# Patient Record
Sex: Female | Born: 1950
Health system: Southern US, Community
[De-identification: ages and names within clinical notes are randomized; demographics above are authoritative.]

## PROBLEM LIST (undated history)

## (undated) DIAGNOSIS — I219 Acute myocardial infarction, unspecified: Secondary | ICD-10-CM

## (undated) DIAGNOSIS — I10 Essential (primary) hypertension: Secondary | ICD-10-CM

## (undated) DIAGNOSIS — M1990 Unspecified osteoarthritis, unspecified site: Secondary | ICD-10-CM

## (undated) DIAGNOSIS — I639 Cerebral infarction, unspecified: Secondary | ICD-10-CM

## (undated) HISTORY — DX: Essential (primary) hypertension: I10

## (undated) HISTORY — PX: TUBAL LIGATION: SHX77

## (undated) HISTORY — PX: ABDOMINAL HYSTERECTOMY: SHX81

## (undated) HISTORY — DX: Acute myocardial infarction, unspecified: I21.9

## (undated) HISTORY — DX: Cerebral infarction, unspecified: I63.9

---

## 2002-09-11 DIAGNOSIS — I219 Acute myocardial infarction, unspecified: Secondary | ICD-10-CM

## 2002-09-11 HISTORY — DX: Acute myocardial infarction, unspecified: I21.9

## 2013-09-11 DIAGNOSIS — I639 Cerebral infarction, unspecified: Secondary | ICD-10-CM

## 2013-09-11 HISTORY — DX: Cerebral infarction, unspecified: I63.9

## 2018-06-14 DIAGNOSIS — Z719 Counseling, unspecified: Secondary | ICD-10-CM | POA: Diagnosis not present

## 2018-06-14 DIAGNOSIS — S8390XS Sprain of unspecified site of unspecified knee, sequela: Secondary | ICD-10-CM | POA: Diagnosis not present

## 2018-06-14 DIAGNOSIS — M79609 Pain in unspecified limb: Secondary | ICD-10-CM | POA: Diagnosis not present

## 2018-06-14 DIAGNOSIS — M549 Dorsalgia, unspecified: Secondary | ICD-10-CM | POA: Diagnosis not present

## 2018-06-30 DIAGNOSIS — R21 Rash and other nonspecific skin eruption: Secondary | ICD-10-CM | POA: Diagnosis not present

## 2018-06-30 DIAGNOSIS — N760 Acute vaginitis: Secondary | ICD-10-CM | POA: Diagnosis not present

## 2018-06-30 DIAGNOSIS — L299 Pruritus, unspecified: Secondary | ICD-10-CM | POA: Diagnosis not present

## 2018-06-30 DIAGNOSIS — L989 Disorder of the skin and subcutaneous tissue, unspecified: Secondary | ICD-10-CM | POA: Diagnosis not present

## 2018-07-27 DIAGNOSIS — S8390XS Sprain of unspecified site of unspecified knee, sequela: Secondary | ICD-10-CM | POA: Diagnosis not present

## 2018-07-27 DIAGNOSIS — Z209 Contact with and (suspected) exposure to unspecified communicable disease: Secondary | ICD-10-CM | POA: Diagnosis not present

## 2018-07-27 DIAGNOSIS — Z719 Counseling, unspecified: Secondary | ICD-10-CM | POA: Diagnosis not present

## 2018-07-27 DIAGNOSIS — M79609 Pain in unspecified limb: Secondary | ICD-10-CM | POA: Diagnosis not present

## 2018-07-29 DIAGNOSIS — M25562 Pain in left knee: Secondary | ICD-10-CM | POA: Diagnosis not present

## 2018-07-29 DIAGNOSIS — M170 Bilateral primary osteoarthritis of knee: Secondary | ICD-10-CM | POA: Diagnosis not present

## 2018-07-29 DIAGNOSIS — M25561 Pain in right knee: Secondary | ICD-10-CM | POA: Diagnosis not present

## 2018-07-30 ENCOUNTER — Ambulatory Visit (INDEPENDENT_AMBULATORY_CARE_PROVIDER_SITE_OTHER): Payer: Medicare HMO | Admitting: Internal Medicine

## 2018-07-30 ENCOUNTER — Encounter: Payer: Self-pay | Admitting: Internal Medicine

## 2018-07-30 VITALS — BP 120/76 | HR 91 | Temp 97.5°F | Ht 63.0 in | Wt 152.0 lb

## 2018-07-30 DIAGNOSIS — R748 Abnormal levels of other serum enzymes: Secondary | ICD-10-CM | POA: Diagnosis not present

## 2018-07-30 DIAGNOSIS — F1020 Alcohol dependence, uncomplicated: Secondary | ICD-10-CM | POA: Diagnosis not present

## 2018-07-30 DIAGNOSIS — M549 Dorsalgia, unspecified: Secondary | ICD-10-CM

## 2018-07-30 DIAGNOSIS — F1010 Alcohol abuse, uncomplicated: Secondary | ICD-10-CM | POA: Diagnosis not present

## 2018-07-30 DIAGNOSIS — I10 Essential (primary) hypertension: Secondary | ICD-10-CM | POA: Diagnosis not present

## 2018-07-30 DIAGNOSIS — M1712 Unilateral primary osteoarthritis, left knee: Secondary | ICD-10-CM

## 2018-07-30 DIAGNOSIS — G8929 Other chronic pain: Secondary | ICD-10-CM

## 2018-07-30 LAB — CMP14 + ANION GAP
ALT: 17 [IU]/L (ref 0–32)
AST: 21 [IU]/L (ref 0–40)
Albumin/Globulin Ratio: 1.8 (ref 1.2–2.2)
Albumin: 4.4 g/dL (ref 3.6–4.8)
Alkaline Phosphatase: 73 [IU]/L (ref 39–117)
Anion Gap: 16 mmol/L (ref 10.0–18.0)
BUN/Creatinine Ratio: 19 (ref 12–28)
BUN: 19 mg/dL (ref 8–27)
Bilirubin Total: 0.3 mg/dL (ref 0.0–1.2)
CO2: 21 mmol/L (ref 20–29)
Calcium: 9.7 mg/dL (ref 8.7–10.3)
Chloride: 104 mmol/L (ref 96–106)
Creatinine, Ser: 1 mg/dL (ref 0.57–1.00)
GFR calc Af Amer: 68 mL/min/{1.73_m2}
GFR calc non Af Amer: 59 mL/min/{1.73_m2} — ABNORMAL LOW
Globulin, Total: 2.5 g/dL (ref 1.5–4.5)
Glucose: 110 mg/dL — ABNORMAL HIGH (ref 65–99)
Potassium: 4.2 mmol/L (ref 3.5–5.2)
Sodium: 141 mmol/L (ref 134–144)
Total Protein: 6.9 g/dL (ref 6.0–8.5)

## 2018-07-30 LAB — CBC
Hematocrit: 31.8 % — ABNORMAL LOW (ref 34.0–46.6)
Hemoglobin: 11 g/dL — ABNORMAL LOW (ref 11.1–15.9)
MCH: 31.6 pg (ref 26.6–33.0)
MCHC: 34.6 g/dL (ref 31.5–35.7)
MCV: 91 fL (ref 79–97)
PLATELETS: 305 10*3/uL (ref 150–450)
RBC: 3.48 x10E6/uL — AB (ref 3.77–5.28)
RDW: 12.7 % (ref 12.3–15.4)
WBC: 5.5 10*3/uL (ref 3.4–10.8)

## 2018-07-30 LAB — LIPID PANEL
CHOL/HDL RATIO: 4.2 ratio (ref 0.0–4.4)
Cholesterol, Total: 241 mg/dL — ABNORMAL HIGH (ref 100–199)
HDL: 57 mg/dL (ref >39)
LDL CALC: 148 mg/dL — AB (ref 0–99)
TRIGLYCERIDES: 179 mg/dL — AB (ref 0–149)
VLDL Cholesterol Cal: 36 mg/dL (ref 5–40)

## 2018-07-30 MED ORDER — ERGOCALCIFEROL 1.25 MG (50000 UT) PO CAPS: 50000.0000 [IU] | ORAL_CAPSULE | ORAL | 0 refills | Status: DC

## 2018-07-30 MED ORDER — QUETIAPINE FUMARATE 50 MG PO TABS: 50.0000 mg | ORAL_TABLET | Freq: Every day | ORAL | 0 refills | Status: DC

## 2018-07-30 MED ORDER — ASPIRIN-DIPYRIDAMOLE ER 25-200 MG PO CP12: 1.0000 | ORAL_CAPSULE | Freq: Every day | ORAL | 0 refills | Status: DC

## 2018-07-30 MED ORDER — ATORVASTATIN CALCIUM 40 MG PO TABS: 40.0000 mg | ORAL_TABLET | Freq: Every day | ORAL | 0 refills | Status: DC

## 2018-07-30 MED ORDER — LISINOPRIL 20 MG PO TABS: 20.0000 mg | ORAL_TABLET | Freq: Every day | ORAL | 0 refills | Status: DC

## 2018-07-30 NOTE — Addendum Note (Signed)
Addended by: Haskel Khan A on: 07/30/2018 12:35 PM   Modules accepted: Orders

## 2018-07-30 NOTE — Patient Instructions (Signed)

## 2018-07-30 NOTE — Progress Notes (Signed)
Subjective:     Patient ID: Judy Martinez , female    DOB: [birth year] , 67 y.o.   MRN: [id number]   Chief Complaint  Patient presents with  . New Patient (Initial Visit)    HPI Pt is here to establish care and [place] this [place].  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 She never ended up with paralysis. She also has chronic back and knee pain and saw MD at [facility] yesterday and was told she has severe OA of L knee and will be having injections.  She has not been evaluated for her back pain in the past, been given tramadol and flexeril prn.  She also states she is supoed to take the Aggrenox bid, but it makes her have nausea.  She is not interested in stop smoking right now.   Past Medical History:  Diagnosis Date  . Heart attack ([place]) 2004  . Hypertension   . Stroke [facility]) 2015     Family History  Problem Relation Age of Onset  . Alcohol abuse Father      Current Outpatient Medications:  .  atorvastatin (LIPITOR) 40 MG tablet, Take 40 mg by mouth daily., Disp: , Rfl:  .  cyclobenzaprine (FLEXERIL) 10 MG tablet, Take 10 mg by mouth 3 (three) times daily as needed for muscle spasms., Disp: , Rfl:  .  dipyridamole-aspirin (AGGRENOX) 200-25 MG 12hr capsule, Take 1 capsule by mouth 2 (two) times daily., Disp: , Rfl:  .  ergocalciferol (VITAMIN D2) 1.25 MG (50000 UT) capsule, Take 50,000 Units by mouth once a week., Disp: , Rfl:  .  ibuprofen (ADVIL,MOTRIN) 800 MG tablet, Take 800 mg by mouth every 8 (eight) hours as needed., Disp: , Rfl:  .  lisinopril (PRINIVIL,ZESTRIL) 20 MG tablet, Take 20 mg by mouth daily., Disp: , Rfl:  .  QUEtiapine (SEROQUEL) 50 MG tablet, Take 50 mg by mouth at bedtime. qhs, Disp: , Rfl:    Allergies  Allergen Reactions  . Penicillins      Review of Systems  Constitutional: Negative for appetite change.  Respiratory: Positive for cough.  Negative for shortness of breath and wheezing.        Has a chronic cough, never been evaluated for COPD  Cardiovascular: Negative for chest pain, palpitations and leg swelling.  Gastrointestinal: Positive for nausea. Negative for abdominal pain.       Gets nausea as noted in HPI  Musculoskeletal: Positive for arthralgias and back pain.       See HPI  Skin: Negative for rash.  Neurological: Negative for speech difficulty and headaches.  Psychiatric/Behavioral: Negative for sleep disturbance.     Today's Vitals   07/30/18 1057  BP: 120/76  Pulse: 91  Temp: (!) 97.5 F (36.4 C)  TempSrc: Oral  SpO2: 99%  Weight: 152 lb (68.9 kg)  Height: 5\' 3"  (1.6 m)   Body mass index is 26.93 kg/m.   Objective:  Physical Exam   Constitutional: She is oriented to person, place, and time. She appears well-developed and well-nourished. No distress.  HENT:  Head: Normocephalic and atraumatic.  Right Ear: External ear normal.  Left Ear: External ear normal.  Nose: Nose normal.  Eyes: Conjunctivae are normal. Right eye exhibits no discharge. Left eye exhibits no discharge. No scleral icterus.  Neck: Neck supple. No thyromegaly present.  No  carotid bruits bilaterally  Cardiovascular: Normal rate and regular rhythm.  No murmur heard. Pulmonary/Chest: Effort normal and breath sounds normal. No respiratory distress.  Musculoskeletal: Normal range of motion. She exhibits no edema.  Lymphadenopathy:    She has no cervical adenopathy.  Neurological: She is alert and oriented to person, place, and time.  Skin: Skin is warm and dry. Capillary refill takes less than 2 seconds. No rash noted. She is not diaphoretic.  Psychiatric: She has a normal mood and affect. Her behavior is normal. Judgment and thought content normal.  Nursing note reviewed.  Assessment And Plan:    1. Essential hypertension- stable - CBC no Diff - CMP14 + Anion Gap - Lipid Profile - Gamma GT, GGT (28206); Future  2.  Primary osteoarthritis of left knee- acute. Will Fu with ortho as scheduled.  3- Chronic back pain- will come back for future evaluation. I explained to her I do not prescribe narcotics and if there is a need for that, I will refer her to pain clinic.  4- alcohol abuse- CMP and GGT ordered. I will address this ones I get her lab results .  Daenerys Buttram RODRIGUEZ-SOUTHWORTH, PA-C

## 2018-08-01 ENCOUNTER — Other Ambulatory Visit: Payer: Self-pay | Admitting: Internal Medicine

## 2018-08-01 DIAGNOSIS — R7309 Other abnormal glucose: Secondary | ICD-10-CM

## 2018-08-09 DIAGNOSIS — Z719 Counseling, unspecified: Secondary | ICD-10-CM | POA: Diagnosis not present

## 2018-08-09 DIAGNOSIS — G4700 Insomnia, unspecified: Secondary | ICD-10-CM | POA: Diagnosis not present

## 2018-08-09 DIAGNOSIS — I259 Chronic ischemic heart disease, unspecified: Secondary | ICD-10-CM | POA: Diagnosis not present

## 2018-08-09 LAB — SPECIMEN STATUS REPORT

## 2018-08-13 ENCOUNTER — Other Ambulatory Visit: Payer: Self-pay | Admitting: Internal Medicine

## 2018-08-13 NOTE — Progress Notes (Signed)
GGT results from 07/30/18 = 73.

## 2018-08-19 LAB — SPECIMEN STATUS REPORT

## 2018-08-19 LAB — GAMMA GT: GGT: 73 IU/L — ABNORMAL HIGH (ref 0–60)

## 2018-08-27 ENCOUNTER — Ambulatory Visit (INDEPENDENT_AMBULATORY_CARE_PROVIDER_SITE_OTHER): Payer: Managed Care, Other (non HMO) | Admitting: Internal Medicine

## 2018-08-27 ENCOUNTER — Encounter: Payer: Self-pay | Admitting: Internal Medicine

## 2018-08-27 VITALS — BP 110/62 | HR 89 | Temp 97.7°F | Ht 63.0 in | Wt 158.0 lb

## 2018-08-27 DIAGNOSIS — M25562 Pain in left knee: Secondary | ICD-10-CM

## 2018-08-27 DIAGNOSIS — D649 Anemia, unspecified: Secondary | ICD-10-CM | POA: Diagnosis not present

## 2018-08-27 DIAGNOSIS — R7309 Other abnormal glucose: Secondary | ICD-10-CM | POA: Diagnosis not present

## 2018-08-27 DIAGNOSIS — R69 Illness, unspecified: Secondary | ICD-10-CM | POA: Diagnosis not present

## 2018-08-27 DIAGNOSIS — G8929 Other chronic pain: Secondary | ICD-10-CM | POA: Diagnosis not present

## 2018-08-27 DIAGNOSIS — D508 Other iron deficiency anemias: Secondary | ICD-10-CM | POA: Diagnosis not present

## 2018-08-27 DIAGNOSIS — I10 Essential (primary) hypertension: Secondary | ICD-10-CM | POA: Diagnosis not present

## 2018-08-27 DIAGNOSIS — F1010 Alcohol abuse, uncomplicated: Secondary | ICD-10-CM

## 2018-08-27 DIAGNOSIS — R945 Abnormal results of liver function studies: Secondary | ICD-10-CM

## 2018-08-27 DIAGNOSIS — Z1212 Encounter for screening for malignant neoplasm of rectum: Secondary | ICD-10-CM

## 2018-08-27 DIAGNOSIS — M5440 Lumbago with sciatica, unspecified side: Secondary | ICD-10-CM

## 2018-08-27 LAB — POC HEMOCCULT BLD/STL (OFFICE/1-CARD/DIAGNOSTIC): Fecal Occult Blood, POC: NEGATIVE

## 2018-08-27 NOTE — Patient Instructions (Addendum)
The minimal drinking allowed for women is 7 drinks a day before it starts causing health problems. 2 shots or liquor, 4 oz of wine or 1 beer is considered one drink.    CBD INSTRUCTIONS: START WITH 3 DROPS TWICE A DAY FOR 5 DAYS, AND INCREASE BY ONE DROP EACH 3 DAYS THERE AFTER       Cirrhosis Cirrhosis is long-term (chronic) liver injury. The liver is your largest internal organ, and it performs many functions. The liver converts food into energy, removes toxic material from your blood, makes important proteins, and absorbs necessary vitamins from your diet. If you have cirrhosis, it means many of your healthy liver cells have been replaced by scar tissue. This prevents blood from flowing through your liver, which makes it difficult for your liver to function. This scarring is not reversible, but treatment can prevent it from getting worse. What are the causes? Hepatitis C and long-term alcohol abuse are the most common causes of cirrhosis. Other causes include:  Nonalcoholic fatty liver disease.  Hepatitis B infection.  Autoimmune hepatitis.  Diseases that cause blockage of ducts inside the liver.  Inherited liver diseases.  Reactions to certain long-term medicines.  Parasitic infections.  Long-term exposure to certain toxins.  What increases the risk? You may have a higher risk of cirrhosis if you:  Have certain hepatitis viruses.  Abuse alcohol, especially if you are female.  Are overweight.  Share needles.  Have unprotected sex with someone who has hepatitis.  What are the signs or symptoms? You may not have any signs and symptoms at first. Symptoms may not develop until the damage to your liver starts to get worse. Signs and symptoms of cirrhosis may include:  Tenderness in the right-upper part of your abdomen.  Weakness and tiredness (fatigue).  Loss of appetite.  Nausea.  Weight loss and muscle loss.  Itchiness.  Yellow skin and eyes  (jaundice).  Buildup of fluid in the abdomen (ascites).  Swelling of the feet and ankles (edema).  Appearance of tiny blood vessels under the skin.  Mental confusion.  Easy bruising and bleeding.  How is this diagnosed? Your health care provider may suspect cirrhosis based on your symptoms and medical history, especially if you have other medical conditions or a history of alcohol abuse. Your health care provider will do a physical exam to feel your liver and check for signs of cirrhosis. Your health care provider may perform other tests, including:  Blood tests to check: ? Whether you have hepatitis B or C. ? Kidney function. ? Liver function.  Imaging tests such as: ? MRI or CT scan to look for changes seen in advanced cirrhosis. ? Ultrasound to see if normal liver tissue is being replaced by scar tissue.  A procedure using a long needle to take a sample of liver tissue (biopsy) for examination under a microscope. Liver biopsy can confirm the diagnosis of cirrhosis.  How is this treated? Treatment depends on how damaged your liver is and what caused the damage. Treatment may include treating cirrhosis symptoms or treating the underlying causes of the condition to try to slow the progression of the damage. Treatment may include:  Making lifestyle changes, such as: ? Eating a healthy diet. ? Restricting salt intake. ? Maintaining a healthy weight. ? Not abusing drugs or alcohol.  Taking medicines to: ? Treat liver infections or other infections. ? Control itching. ? Reduce fluid buildup. ? Reduce certain blood toxins. ? Reduce risk of bleeding from  enlarged blood vessels in the stomach or esophagus (varices).  If varices are causing bleeding problems, you may need treatment with a procedure that ties up the vessels causing them to fall off (band ligation).  If cirrhosis is causing your liver to fail, your health care provider may recommend a liver transplant.  Other  treatments may be recommended depending on any complications of cirrhosis, such as liver-related kidney failure (hepatorenal syndrome).  Follow these instructions at home:  Take medicines only as directed by your health care provider. Do not use drugs that are toxic to your liver. Ask your health care provider before taking any new medicines, including over-the-counter medicines.  Rest as needed.  Eat a well-balanced diet. Ask your health care provider or dietitian for more information.  You may have to follow a low-salt diet or restrict your water intake as directed.  Do not drink alcohol. This is especially important if you are taking acetaminophen.  Keep all follow-up visits as directed by your health care provider. This is important. Contact a health care provider if:  You have fatigue or weakness that is getting worse.  You develop swelling of the hands, feet, legs, or face.  You have a fever.  You develop loss of appetite.  You have nausea or vomiting.  You develop jaundice.  You develop easy bruising or bleeding. Get help right away if:  You vomit bright red blood or a material that looks like coffee grounds.  You have blood in your stools.  Your stools appear black and tarry.  You become confused.  You have chest pain or trouble breathing. This information is not intended to replace advice given to you by your health care provider. Make sure you discuss any questions you have with your health care provider. Document Released: 08/28/2005 Document Revised: 01/06/2016 Document Reviewed: 05/06/2014 Elsevier Interactive Patient Education  [employer].

## 2018-08-27 NOTE — Progress Notes (Signed)
Subjective:     Patient ID: Judy Martinez , female    DOB: [birth year] , 67 y.o.   MRN: [id number]   Chief Complaint  Patient presents with  . Follow-up    rectal exam also needs A1c BW done     HPI  Comes for rectal exam and FU on increased drinking. She states she has been drinking beer instead, but has not decreased much. Declined help to stop drinking and thinks she can do it herself. She admits she does drink some nights to mask her back and L knee pain. When she saw her prior PCP he gave her Tramadol for pain, but she never had a work up from where her pain etiology is from. Her L knee pain stems from compound fracture of her L lower leg and change in weather brings on this pain. Tylenol and Ibuprofen have not helped her pain. She has never tried CBC for pain.    Past Medical History:  Diagnosis Date  . Heart attack ([place]) 2004  . Hypertension   . Stroke [facility]) 2015     Family History  Problem Relation Age of Onset  . Alcohol abuse Father     Current Outpatient Medications:  .  atorvastatin (LIPITOR) 40 MG tablet, Take 1 tablet (40 mg total) by mouth daily., Disp: 30 tablet, Rfl: 0 .  cyclobenzaprine (FLEXERIL) 10 MG tablet, Take 10 mg by mouth 3 (three) times daily as needed for muscle spasms., Disp: , Rfl:  .  dipyridamole-aspirin (AGGRENOX) 200-25 MG 12hr capsule, Take 1 capsule by mouth daily., Disp: 30 capsule, Rfl: 0 .  ergocalciferol (VITAMIN D2) 1.25 MG (50000 UT) capsule, Take 1 capsule (50,000 Units total) by mouth once a week., Disp: 4 capsule, Rfl: 0 .  ibuprofen (ADVIL,MOTRIN) 800 MG tablet, Take 800 mg by mouth every 8 (eight) hours as needed., Disp: , Rfl:  .  lisinopril (PRINIVIL,ZESTRIL) 20 MG tablet, Take 1 tablet (20 mg total) by mouth daily., Disp: 30 tablet, Rfl: 0 .  QUEtiapine (SEROQUEL) 50 MG tablet, Take 1 tablet (50 mg total) by mouth at bedtime. qhs, Disp: 30 tablet, Rfl: 0   Allergies  Allergen Reactions  . Penicillins      Review of Systems   Constitutional: Negative for diaphoresis.  Gastrointestinal: Negative for abdominal pain, anal bleeding, blood in stool, constipation, diarrhea, nausea and vomiting.  Genitourinary: Negative for dysuria, frequency and urgency.  Musculoskeletal: Positive for arthralgias and back pain. Negative for gait problem and joint swelling.  Neurological: Negative for headaches.     Today's Vitals   08/27/18 1157  BP: 110/62  Pulse: 89  Temp: 97.7 F (36.5 C)  TempSrc: Oral  SpO2: 97%  Weight: 158 lb (71.7 kg)  Height: 5\' 3"  (1.6 m)   Body mass index is 27.99 kg/m.   Objective:  Physical Exam Vitals signs and nursing note reviewed.  Constitutional:      General: She is not in acute distress.    Appearance: She is normal weight. She is not ill-appearing, toxic-appearing or diaphoretic.  HENT:     Head: Normocephalic.     Right Ear: External ear normal.     Left Ear: External ear normal.     Nose: Nose normal.  Eyes:     General: No scleral icterus.    Conjunctiva/sclera: Conjunctivae normal.  Neck:     Musculoskeletal: Neck supple.  Cardiovascular:     Rate and Rhythm: Normal rate and regular rhythm.     Heart  sounds: No murmur.  Pulmonary:     Effort: Pulmonary effort is normal.     Breath sounds: Normal breath sounds.  Genitourinary:    Rectum: Guaiac result negative.     Comments: Rectal exam normal.  Skin:    General: Skin is warm and dry.  Neurological:     General: No focal deficit present.     Mental Status: She is alert and oriented to person, place, and time.  Psychiatric:        Mood and Affect: Mood normal.        Behavior: Behavior normal.        Thought Content: Thought content normal.     Assessment And Plan:     1. Other iron deficiency anemia - CMP14 + Anion Gap - Iron and TIBC([facility]) - Ferritin - Vitamin B12 - RBC Folate - Gamma GT, GGT (31438) - CBC no Diff  2. Abnormal liver function- elevated GGT due to alcohol over use  3.  Abnormal glucose- acute - Hemoglobin A1c   4- Chronic back pain- Seeking pain medication sent to get Lumbar xray  5- Chronic L knee and lower leg pain- lower leg secondary to past fracture. L knee is unknown since she never had a work up. L knee xray ordered.  6- Alcohol abuse- chronic, she declined to be referred for counseling to addiction specialist.  She will come back in 2 weeks for FU labs, and at that time I will examine her back and L knee more in detail and with more time.   Marimar Suber RODRIGUEZ-SOUTHWORTH, PA-C

## 2018-08-28 DIAGNOSIS — M545 Low back pain: Secondary | ICD-10-CM | POA: Diagnosis not present

## 2018-08-28 DIAGNOSIS — M25562 Pain in left knee: Secondary | ICD-10-CM

## 2018-08-28 DIAGNOSIS — F5104 Psychophysiologic insomnia: Secondary | ICD-10-CM | POA: Insufficient documentation

## 2018-08-28 DIAGNOSIS — I10 Essential (primary) hypertension: Secondary | ICD-10-CM | POA: Insufficient documentation

## 2018-08-28 DIAGNOSIS — G8929 Other chronic pain: Secondary | ICD-10-CM | POA: Insufficient documentation

## 2018-08-28 DIAGNOSIS — F1010 Alcohol abuse, uncomplicated: Secondary | ICD-10-CM | POA: Insufficient documentation

## 2018-08-28 DIAGNOSIS — E785 Hyperlipidemia, unspecified: Secondary | ICD-10-CM | POA: Insufficient documentation

## 2018-08-28 DIAGNOSIS — Z8673 Personal history of transient ischemic attack (TIA), and cerebral infarction without residual deficits: Secondary | ICD-10-CM | POA: Insufficient documentation

## 2018-08-28 DIAGNOSIS — S8390XS Sprain of unspecified site of unspecified knee, sequela: Secondary | ICD-10-CM | POA: Diagnosis not present

## 2018-08-28 DIAGNOSIS — M5440 Lumbago with sciatica, unspecified side: Secondary | ICD-10-CM

## 2018-08-28 DIAGNOSIS — M79609 Pain in unspecified limb: Secondary | ICD-10-CM | POA: Diagnosis not present

## 2018-08-28 DIAGNOSIS — Z719 Counseling, unspecified: Secondary | ICD-10-CM | POA: Diagnosis not present

## 2018-08-28 LAB — CMP14 + ANION GAP
ALK PHOS: 66 IU/L (ref 39–117)
ALT: 28 IU/L (ref 0–32)
ANION GAP: 16 mmol/L (ref 10.0–18.0)
AST: 27 IU/L (ref 0–40)
Albumin/Globulin Ratio: 1.5 (ref 1.2–2.2)
Albumin: 4.4 g/dL (ref 3.6–4.8)
BILIRUBIN TOTAL: 0.4 mg/dL (ref 0.0–1.2)
BUN/Creatinine Ratio: 24 (ref 12–28)
BUN: 30 mg/dL — ABNORMAL HIGH (ref 8–27)
CHLORIDE: 105 mmol/L (ref 96–106)
CO2: 21 mmol/L (ref 20–29)
CREATININE: 1.26 mg/dL — AB (ref 0.57–1.00)
Calcium: 9.9 mg/dL (ref 8.7–10.3)
GFR calc Af Amer: 51 mL/min/{1.73_m2} — ABNORMAL LOW (ref >59)
GFR calc non Af Amer: 45 mL/min/{1.73_m2} — ABNORMAL LOW (ref >59)
GLOBULIN, TOTAL: 3 g/dL (ref 1.5–4.5)
Glucose: 101 mg/dL — ABNORMAL HIGH (ref 65–99)
Potassium: 4.2 mmol/L (ref 3.5–5.2)
SODIUM: 142 mmol/L (ref 134–144)
Total Protein: 7.4 g/dL (ref 6.0–8.5)

## 2018-08-28 LAB — VITAMIN B12: Vitamin B-12: 362 pg/mL (ref 232–1245)

## 2018-08-28 LAB — FERRITIN: Ferritin: 30 ng/mL (ref 15–150)

## 2018-08-28 LAB — CBC
HEMATOCRIT: 35.1 % (ref 34.0–46.6)
HEMOGLOBIN: 11.4 g/dL (ref 11.1–15.9)
MCH: 30.6 pg (ref 26.6–33.0)
MCHC: 32.5 g/dL (ref 31.5–35.7)
MCV: 94 fL (ref 79–97)
Platelets: 300 10*3/uL (ref 150–450)
RBC: 3.72 x10E6/uL — ABNORMAL LOW (ref 3.77–5.28)
RDW: 13.4 % (ref 12.3–15.4)
WBC: 7.6 10*3/uL (ref 3.4–10.8)

## 2018-08-28 LAB — IRON AND TIBC
Iron Saturation: 35 % (ref 15–55)
Iron: 117 ug/dL (ref 27–139)
TIBC: 334 ug/dL (ref 250–450)
UIBC: 217 ug/dL (ref 118–369)

## 2018-08-28 LAB — GAMMA GT: GGT: 90 IU/L — ABNORMAL HIGH (ref 0–60)

## 2018-08-28 LAB — FOLATE RBC
Folate, Hemolysate: 315.3 ng/mL
Folate, RBC: 898 ng/mL (ref >498)

## 2018-08-29 ENCOUNTER — Ambulatory Visit
Admission: RE | Admit: 2018-08-29 | Discharge: 2018-08-29 | Disposition: A | Discharge status: Home / Self Care | Payer: Medicare HMO | Source: Ambulatory Visit | Attending: Internal Medicine | Admitting: Internal Medicine

## 2018-08-29 DIAGNOSIS — M25562 Pain in left knee: Secondary | ICD-10-CM

## 2018-08-29 DIAGNOSIS — M5440 Lumbago with sciatica, unspecified side: Principal | ICD-10-CM

## 2018-08-29 DIAGNOSIS — G8929 Other chronic pain: Secondary | ICD-10-CM

## 2018-08-29 DIAGNOSIS — M1712 Unilateral primary osteoarthritis, left knee: Secondary | ICD-10-CM | POA: Diagnosis not present

## 2018-08-29 DIAGNOSIS — M47816 Spondylosis without myelopathy or radiculopathy, lumbar region: Secondary | ICD-10-CM | POA: Diagnosis not present

## 2018-08-30 ENCOUNTER — Other Ambulatory Visit: Payer: Self-pay | Admitting: Internal Medicine

## 2018-08-30 DIAGNOSIS — M5440 Lumbago with sciatica, unspecified side: Principal | ICD-10-CM

## 2018-08-30 DIAGNOSIS — M1712 Unilateral primary osteoarthritis, left knee: Secondary | ICD-10-CM

## 2018-08-30 DIAGNOSIS — G8929 Other chronic pain: Secondary | ICD-10-CM

## 2018-09-10 ENCOUNTER — Ambulatory Visit: Payer: Managed Care, Other (non HMO) | Admitting: Internal Medicine

## 2018-09-12 ENCOUNTER — Encounter: Payer: Self-pay | Admitting: Internal Medicine

## 2018-09-12 ENCOUNTER — Ambulatory Visit (INDEPENDENT_AMBULATORY_CARE_PROVIDER_SITE_OTHER): Payer: Medicare HMO | Admitting: Internal Medicine

## 2018-09-12 VITALS — BP 116/60 | HR 88 | Temp 97.6°F | Ht 63.0 in | Wt 153.0 lb

## 2018-09-12 DIAGNOSIS — M25562 Pain in left knee: Secondary | ICD-10-CM

## 2018-09-12 DIAGNOSIS — R748 Abnormal levels of other serum enzymes: Secondary | ICD-10-CM

## 2018-09-12 DIAGNOSIS — M549 Dorsalgia, unspecified: Secondary | ICD-10-CM

## 2018-09-12 NOTE — Progress Notes (Signed)
Subjective:     Patient ID: Judy Martinez , female    DOB: 1951/02/12 , 68 y.o.   MRN: [id number]   Chief Complaint  Patient presents with  . Follow-up    liver function     HPI Pt is here for FU liver enzymes. She has stopped drinking cold [place], has not had any seizures. Has lost ortho phone # and would like to get the number to FU on her back and knee.    Past Medical History:  Diagnosis Date  . Heart attack ([place]) 2004  . Hypertension   . Stroke [facility]) 2015     Family History  Problem Relation Age of Onset  . Alcohol abuse Father      Current Outpatient Medications:  .  atorvastatin (LIPITOR) 40 MG tablet, TAKE 1 TABLET BY MOUTH DAILY, Disp: 30 tablet, Rfl: 0 .  cyclobenzaprine (FLEXERIL) 10 MG tablet, Take 10 mg by mouth 3 (three) times daily as needed for muscle spasms., Disp: , Rfl:  .  dipyridamole-aspirin (AGGRENOX) 200-25 MG 12hr capsule, TAKE ONE CAPSULE BY MOUTH DAILY FOR 30 DAYS, Disp: 60 capsule, Rfl: 0 .  ibuprofen (ADVIL,MOTRIN) 800 MG tablet, Take 800 mg by mouth every 8 (eight) hours as needed., Disp: , Rfl:  .  lisinopril (PRINIVIL,ZESTRIL) 20 MG tablet, TAKE 1 TABLET BY MOUTH DAILY, Disp: 30 tablet, Rfl: 0 .  QUEtiapine (SEROQUEL) 50 MG tablet, TAKE 1 TABLET BY MOUTH EVERY NIGHT AT BEDTIME, Disp: 30 tablet, Rfl: 0 .  Vitamin D, Ergocalciferol, (DRISDOL) 1.25 MG (50000 UT) CAPS capsule, TAKE ONE CAPSULE BY MOUTH ONCE A WEEK, Disp: 4 capsule, Rfl: 0   Allergies  Allergen Reactions  . Penicillins      Review of Systems  Constitutional: Negative for chills, diaphoresis and fever.  Gastrointestinal: Negative for abdominal pain, diarrhea, nausea and vomiting.  Musculoskeletal: Positive for arthralgias, back pain and joint swelling.       Chronic back and L knee pain.   Skin: Negative for rash.  Neurological: Negative for dizziness, seizures and light-headedness.  Hematological: Does not bruise/bleed easily.     Today's Vitals   09/12/18 1207  BP:  116/60  Pulse: 88  Temp: 97.6 F (36.4 C)  TempSrc: Oral  SpO2: 97%  Weight: 153 lb (69.4 kg)  Height: 5\' 3"  (1.6 m)   Body mass index is 27.1 kg/m.   Objective:  Physical Exam Vitals signs and nursing note reviewed.  Constitutional:      General: She is not in acute distress.    Appearance: Normal appearance.  HENT:     Head: Normocephalic.     Right Ear: Tympanic membrane and ear canal normal.     Left Ear: Tympanic membrane and ear canal normal.     Nose: Nose normal.  Eyes:     General: No scleral icterus.    Conjunctiva/sclera: Conjunctivae normal.     Pupils: Pupils are equal, round, and reactive to light.  Neck:     Musculoskeletal: Neck supple.  Cardiovascular:     Rate and Rhythm: Normal rate and regular rhythm.     Heart sounds: No murmur.  Pulmonary:     Effort: Pulmonary effort is normal.  Abdominal:     General: Bowel sounds are normal. There is no distension.     Palpations: Abdomen is soft.     Tenderness: There is no abdominal tenderness. There is no guarding.  Lymphadenopathy:     Cervical: No cervical adenopathy.  Skin:  General: Skin is warm and dry.     Coloration: Skin is not jaundiced.     Findings: No bruising or rash.  Neurological:     General: No focal deficit present.     Mental Status: She is alert and oriented to person, place, and time.  Psychiatric:        Mood and Affect: Mood normal.        Behavior: Behavior normal.        Thought Content: Thought content normal.        Judgment: Judgment normal.    Assessment And Plan:    1. Elevated liver enzymes- acute - Liver Profile - Gamma GT, GGT (79892) I discussed with her about letting me know when she is ready to stop smoking.   FU 2 months.  Sydnee Cabal gave her the phone number for ortho.    Keona Bilyeu RODRIGUEZ-SOUTHWORTH, PA-C

## 2018-09-13 LAB — HEPATIC FUNCTION PANEL
ALT: 17 IU/L (ref 0–32)
AST: 17 IU/L (ref 0–40)
Albumin: 4.2 g/dL (ref 3.6–4.8)
Alkaline Phosphatase: 66 IU/L (ref 39–117)
BILIRUBIN, DIRECT: 0.05 mg/dL (ref 0.00–0.40)
Bilirubin Total: <0.2 mg/dL (ref 0.0–1.2)
Total Protein: 6.6 g/dL (ref 6.0–8.5)

## 2018-09-13 LAB — GAMMA GT: GGT: 61 IU/L — ABNORMAL HIGH (ref 0–60)

## 2018-09-16 ENCOUNTER — Ambulatory Visit (INDEPENDENT_AMBULATORY_CARE_PROVIDER_SITE_OTHER): Payer: Medicare HMO | Admitting: Orthopaedic Surgery

## 2018-09-16 ENCOUNTER — Encounter (INDEPENDENT_AMBULATORY_CARE_PROVIDER_SITE_OTHER): Payer: Self-pay | Admitting: Orthopaedic Surgery

## 2018-09-16 VITALS — BP 104/60 | Ht 63.0 in | Wt 153.0 lb

## 2018-09-16 DIAGNOSIS — G8929 Other chronic pain: Secondary | ICD-10-CM | POA: Diagnosis not present

## 2018-09-16 DIAGNOSIS — M25562 Pain in left knee: Secondary | ICD-10-CM | POA: Diagnosis not present

## 2018-09-16 NOTE — Progress Notes (Signed)
Office Visit Note   Patient: Judy Martinez           Date of Birth: 1951-02-02           MRN: [id number] Visit Date: 09/16/2018              Requested by: Shelby Mattocks, PA-C [address] [place], [place] [address] PCP: Shelby Mattocks, PA-C   Assessment & Plan: Visit Diagnoses:  1. Chronic pain of left knee     Plan: End-stage osteoarthritis left knee.  Long discussion with Judy Martinez regarding the diagnosis and treatment options.  Also appears to have some osteoarthritis of her lumbar spine has had problem with her knee for many months with prior cortisone injections and over-the-counter medicines.  She would like to proceed with a knee replacement.  Will need clearance from Dr. Baird Cancer. Office time was over 45 minutes 50% of the time in counseling.  I have also discussed smoking cessation and stopping cocaine with increased risk of infection and DVT have also discussed incision, hospital stay, physical therapy, use of walker and what she can expect in terms of potential complications.  She would like to proceed  Follow-Up Instructions: Return will schedule left TKR.   Orders:  No orders of the defined types were placed in this encounter.  No orders of the defined types were placed in this encounter.     Procedures: No procedures performed   Clinical Data: No additional findings.   Subjective: Chief Complaint  Patient presents with  . Lower Back - Injury, Pain  . Left Knee - Pain, Edema  . Knee Pain    Pt stated injury Lt--sharp pain, edema for about 2 years, worse when walking/bending, no surgery.  Tried tylenol or muscle relaxer.  JudyJudy Martinez is 68 years old visited the office evaluation of left knee and low back pain.  She has had trouble for several years and thinks that the problem started after she had a fall.  She has been seen on a number of occasions in [place] where she was living and has had cortisone injections  at least twice in her left knee.  She continues to have pain to the point of compromise with recurrent swelling popping clicking and occasionally a sensation of her knee giving way.  She has tried some over-the-counter medicines.  She does smoke and occasionally even smokes "cocaine".  Also has had some issues with her back.  The pain seems to be localized to her back without any radicular discomfort.  She has not had any bowel is now living in [place] and has been for approximately 2 months.  She has seen Dr.Sanders as her primary care physician.  Did review the films of her knee and her back on the PACS system.  She has collapse of the medial compartment with about 2 degrees of varus.  Cholecystic and has subchondral sclerosis.  Lateral view demonstrates some calcification within the arterial tree and some patellofemoral changes.  No acute changes  Films of the lumbar spine were also reviewed.  There is minimal anterior listhesis of L4 on 5.  The disc spaces appear to be well-maintained.  There is some degenerative change at the facet joints at 4 5 and 5 1.  There is also some mild calcific there was a mild less than 5 degree left lumbar scoliosis  HPI  Review of Systems  Constitutional: Negative.   HENT: Negative.   Eyes: Positive for visual disturbance.  Respiratory: Negative.  Cardiovascular: Negative.   Gastrointestinal: Positive for constipation and diarrhea.  Endocrine: Negative.   Genitourinary: Negative.   Musculoskeletal: Positive for back pain and gait problem.  Skin: Negative.   Allergic/Immunologic: Negative.   Hematological: Negative.   Psychiatric/Behavioral: Negative.      Objective: Vital Signs: BP 104/60   Ht 5\' 3"  (1.6 m)   Wt 153 lb (69.4 kg)   BMI 27.10 kg/m   Physical Exam Constitutional:      Appearance: She is well-developed.  Eyes:     Pupils: Pupils are equal, round, and reactive to light.  Pulmonary:     Effort: Pulmonary effort is normal.   Skin:    General: Skin is warm and dry.  Neurological:     Mental Status: She is alert and oriented to person, place, and time.  Psychiatric:        Behavior: Behavior normal.     Ortho Exam awake alert and oriented x3.  Comfortable sitting.  Able to fully extend left knee.  No effusion.  A little opening medially with a valgus stress.  No opening laterally with a varus stress.  Flexed only about 95 degrees.  Predominately medial joint pain.  Minimal patellar crepitation no lateral joint pain.  No calf pain or distal edema.  Neurologically appears to be intact.  Straight leg raise negative.  Painless range of motion left hip.  Specialty Comments:  No specialty comments available.  Imaging: No results found.   PMFS History: Patient Active Problem List   Diagnosis Date Noted  . Chronic bilateral low back pain with sciatica 08/28/2018  . Chronic pain of left knee 08/28/2018  . Alcohol abuse   . Chronic insomnia   . Hyperlipidemia   . Essential hypertension   . History of CVA (cerebrovascular accident)    Past Medical History:  Diagnosis Date  . Heart attack ([place]) 2004  . Hypertension   . Stroke [facility]) 2015    Family History  Problem Relation Age of Onset  . Alcohol abuse Father     Past Surgical History:  Procedure Laterality Date  . ABDOMINAL HYSTERECTOMY    . TUBAL LIGATION     Social History   Occupational History  . Not on file  Tobacco Use  . Smoking status: Current Every Day Smoker    Packs/day: 0.50    Years: 15.00    Pack years: 7.50    Types: Cigarettes  . Smokeless tobacco: Current User  Substance and Sexual Activity  . Alcohol use: Not Currently  . Drug use: Yes    Types: Cocaine  . Sexual activity: Yes

## 2018-09-19 ENCOUNTER — Telehealth: Payer: Self-pay | Admitting: Internal Medicine

## 2018-09-19 NOTE — Telephone Encounter (Signed)
Preop- physical scheduled

## 2018-09-25 ENCOUNTER — Ambulatory Visit
Admission: RE | Admit: 2018-09-25 | Discharge: 2018-09-25 | Disposition: A | Discharge status: Home / Self Care | Payer: Medicare HMO | Source: Ambulatory Visit | Attending: Internal Medicine | Admitting: Internal Medicine

## 2018-09-25 ENCOUNTER — Other Ambulatory Visit: Payer: Self-pay

## 2018-09-25 ENCOUNTER — Ambulatory Visit (INDEPENDENT_AMBULATORY_CARE_PROVIDER_SITE_OTHER): Payer: Medicare HMO | Admitting: Internal Medicine

## 2018-09-25 ENCOUNTER — Encounter: Payer: Self-pay | Admitting: Internal Medicine

## 2018-09-25 VITALS — BP 122/72 | HR 72 | Temp 98.1°F | Ht 63.0 in | Wt 150.8 lb

## 2018-09-25 DIAGNOSIS — F17200 Nicotine dependence, unspecified, uncomplicated: Secondary | ICD-10-CM

## 2018-09-25 DIAGNOSIS — R69 Illness, unspecified: Secondary | ICD-10-CM | POA: Diagnosis not present

## 2018-09-25 DIAGNOSIS — R9431 Abnormal electrocardiogram [ECG] [EKG]: Secondary | ICD-10-CM

## 2018-09-25 DIAGNOSIS — I10 Essential (primary) hypertension: Secondary | ICD-10-CM

## 2018-09-25 DIAGNOSIS — F1490 Cocaine use, unspecified, uncomplicated: Secondary | ICD-10-CM | POA: Diagnosis not present

## 2018-09-25 DIAGNOSIS — R748 Abnormal levels of other serum enzymes: Secondary | ICD-10-CM | POA: Diagnosis not present

## 2018-09-25 DIAGNOSIS — Z01818 Encounter for other preprocedural examination: Secondary | ICD-10-CM

## 2018-09-25 DIAGNOSIS — Z8659 Personal history of other mental and behavioral disorders: Secondary | ICD-10-CM

## 2018-09-25 NOTE — Progress Notes (Signed)
Subjective:     Patient ID: Judy Martinez , female    DOB: December 25, 1950 , 68 y.o.   MRN: [id number]   Chief Complaint  Patient presents with  . Pre-op Exam    HPI  Has had general anesthesia before for a tubal and did not have any complications. Denies nausea post surgery. She is not aware of any anesthesia complications in her family, but she is not very close to them. She states she is still dry and has not drank since she told me last time. The last time she used cocaine via smoking it was 4 days ago, and did it 3 times. She uses this to help her relax.    Past Medical History:  Diagnosis Date  . Heart attack ([place]) 2004  . Hypertension   . Stroke [facility]) 2015     Family History  Problem Relation Age of Onset  . Alcohol abuse Father      Current Outpatient Medications:  .  atorvastatin (LIPITOR) 40 MG tablet, TAKE 1 TABLET BY MOUTH DAILY, Disp: 30 tablet, Rfl: 0 .  cyclobenzaprine (FLEXERIL) 10 MG tablet, Take 10 mg by mouth 3 (three) times daily as needed for muscle spasms., Disp: , Rfl:  .  dipyridamole-aspirin (AGGRENOX) 200-25 MG 12hr capsule, TAKE ONE CAPSULE BY MOUTH DAILY FOR 30 DAYS, Disp: 60 capsule, Rfl: 0 .  ibuprofen (ADVIL,MOTRIN) 800 MG tablet, Take 800 mg by mouth every 8 (eight) hours as needed., Disp: , Rfl:  .  lisinopril (PRINIVIL,ZESTRIL) 20 MG tablet, TAKE 1 TABLET BY MOUTH DAILY, Disp: 30 tablet, Rfl: 0 .  QUEtiapine (SEROQUEL) 50 MG tablet, TAKE 1 TABLET BY MOUTH EVERY NIGHT AT BEDTIME, Disp: 30 tablet, Rfl: 0 .  Vitamin D, Ergocalciferol, (DRISDOL) 1.25 MG (50000 UT) CAPS capsule, TAKE ONE CAPSULE BY MOUTH ONCE A WEEK, Disp: 4 capsule, Rfl: 0   Allergies  Allergen Reactions  . Penicillins      Review of Systems  Positive for L knee pain, back pain, admitting use of cocaine for anxiety  And recreation. The rest is negative.   Today's Vitals   09/25/18 1159  BP: 122/72  Pulse: 72  Temp: 98.1 F (36.7 C)  TempSrc: Oral  SpO2: 97%  Weight: 150 lb  12.8 oz (68.4 kg)  Height: 5\' 3"  (1.6 m)   Body mass index is 26.71 kg/m.   Objective:  Physical Exam  BP 122/72 (BP Location: Left Arm, Patient Position: Sitting, Cuff Size: Normal)   Pulse 72   Temp 98.1 F (36.7 C) (Oral)   Ht 5\' 3"  (1.6 m)   Wt 150 lb 12.8 oz (68.4 kg)   SpO2 97%   BMI 26.71 kg/m   General Appearance:    Alert, cooperative, no distress, appears stated age  Head:    Normocephalic, without obvious abnormality, atraumatic  Eyes:    PERRL, conjunctiva/corneas clear, EOM's intact.  Ears:    Normal TM's and external ear canals, both ears  Nose:   Nares normal, septum midline, mucosa normal, no drainage    or sinus tenderness  Throat:   Lips, mucosa, and tongue normal; teeth and gums normal  Neck:   Supple, symmetrical, trachea midline, no adenopathy;    thyroid:  no enlargement/tenderness/nodules; no carotid   bruits  Back:     Symmetric, no curvature, ROM normal.  Lungs:     Clear to auscultation bilaterally, respirations unlabored  Chest Wall:    No tenderness or deformity   Heart:  Regular rate and rhythm, S1 and S2 normal, 1/6 murmur only heard when she was laying down. I had not heard it before on prior exams.        Abdomen:     Soft, non-tender, bowel sounds active all four quadrants,    no masses, no organomegaly. No pulsatile mass or abdominal bruits heard.         Extremities:   Has tenderness on L knee with palpation and ROM, no effusion noted today,  no cyanosis or edema  Pulses:   2+ and symmetric on femoral and dorsalis pedis, +1/4 posterior tibialis and poplateal  Skin:   Skin color, texture, turgor normal, no rashes or lesions.   Lymph nodes:   Cervical, supraclavicular  Neurologic:   CNII-XII intact, normal strength, sensation and +2/4 bilateral patellar reflexes, +1/4 bilateral achillis. Rhomberg and tandem test are normal.     EKG- sinus rhythm with non-specific T abnormalities. Abnormal.  Assessment And Plan:   1. Preop exam for  internal medicine- routine - DG Chest 2 View; Future - CBC no Diff - CMP14 + Anion Gap - Ambulatory referral to Cardiology She was not cleared for surgery today. She was sent to cardiology for clearance due to abnormal EKG, form given to [facility] to fax to ortho. Chest xray ordered- reading is pending. 2. Elevated liver enzymes-hx of alcoholism. Has not been drinking.  - CMP14 + Anion Gap - Gamma GT, GGT (62229)  3. Cocaine use  - Ambulatory referral to Cardiology Serum drug screen ordered since she could not void for [place].  I asked her if she was aware that cocaine can cause coronary spasm and she could have a sudden cardiac event which could be fatal, and she stated she was not aware of this.  4- abnormal EKG- I dont have any prior ones to compare, but due to being a smoker and cocaine user she is at high risk. 5- essential HTN- stable on current meds.   I offered pt to see psychiatry for anxiety, so she can D/c cocaine and she declined. Fu as scheduled for HTN.  Sunday Spillers RODRIGUEZ-SOUTHWORTH, PA-C

## 2018-09-26 ENCOUNTER — Other Ambulatory Visit: Payer: Self-pay | Admitting: Internal Medicine

## 2018-10-01 ENCOUNTER — Other Ambulatory Visit: Payer: Self-pay | Admitting: Internal Medicine

## 2018-10-01 ENCOUNTER — Telehealth: Payer: Self-pay | Admitting: Internal Medicine

## 2018-10-01 ENCOUNTER — Other Ambulatory Visit (HOSPITAL_COMMUNITY): Payer: Self-pay | Admitting: Internal Medicine

## 2018-10-01 NOTE — Addendum Note (Signed)
Addended by: Nicki Guadalajara on: 10/01/2018 03:41 PM   Modules accepted: Orders

## 2018-10-01 NOTE — Progress Notes (Unsigned)
Badgett, Tianna  [place], [place], [place]        Dr long-stokes office called pt will have teeth removed and wanted to know if she should stop her blood thinner before the procedure [phone]    I called them this am, but they are not open til 9 am. If they call before I'm able to call them, she needs to be off the blood thinner 7 days before the procedure and can be re-started 24 hours after the procedure.

## 2018-10-01 NOTE — Telephone Encounter (Signed)
-----   Message from [place] sent at 09/26/2018  4:55 PM EST ----- Dr Bonnee Quin office called pt will have teeth removed and wanted to know if she should stop her blood thinner before the procedure [phone]

## 2018-10-03 ENCOUNTER — Encounter (INDEPENDENT_AMBULATORY_CARE_PROVIDER_SITE_OTHER): Payer: Self-pay | Admitting: Orthopedic Surgery

## 2018-10-03 ENCOUNTER — Ambulatory Visit (INDEPENDENT_AMBULATORY_CARE_PROVIDER_SITE_OTHER): Payer: Medicare HMO | Admitting: Orthopedic Surgery

## 2018-10-03 VITALS — BP 109/73 | HR 77 | Resp 18 | Ht 63.0 in | Wt 153.0 lb

## 2018-10-03 DIAGNOSIS — M1712 Unilateral primary osteoarthritis, left knee: Secondary | ICD-10-CM

## 2018-10-03 NOTE — H&P (Signed)
Judy Fears, MD   Biagio Borg, PA-C [address], [place], [place]  [address]                             [phone]   ORTHOPAEDIC HISTORY & PHYSICAL  Rachna Schonberger MRN:  [id number] DOB/SEX:  Apr 06, 1951/female  CHIEF COMPLAINT:  Painful left Knee  HISTORY: Patient is a 68 y.o. female presented with a history of pain in the left knee for 2 years. Onset of symptoms was gradual starting 2 years ago with gradually worsening course since that time. Prior procedures on the knee are none. Patient has been treated conservatively with over-the-counter NSAIDs and activity modification. Patient currently rates pain in the knee at 8 out of 10 with activity. There is pain at night. present. none They have been previously treated with: NSAIDS: NSAID with mild improvement, still taking  Knee injection with corticosteroid  was performed Knee injection with visco supplementation was performed Medications: Aleve with mild improvement  PAST MEDICAL HISTORY: Patient Active Problem List   Diagnosis Date Noted  . Elevated liver enzymes 09/25/2018  . Cocaine use 09/25/2018  . Smoker unmotivated to quit 09/25/2018  . Chronic bilateral low back pain with sciatica 08/28/2018  . Chronic pain of left knee 08/28/2018  . Alcohol abuse   . Chronic insomnia   . Hyperlipidemia   . Essential hypertension   . History of CVA (cerebrovascular accident)    Past Medical History:  Diagnosis Date  . Heart attack ([place]) 2004  . Hypertension   . Stroke [facility]) 2015   Past Surgical History:  Procedure Laterality Date  . ABDOMINAL HYSTERECTOMY    . TUBAL LIGATION       MEDICATIONS PRIOR TO ADMISSION: No current facility-administered medications for this encounter.   Current Outpatient Medications:  .  atorvastatin (LIPITOR) 40 MG tablet, TAKE 1 TABLET BY MOUTH DAILY, Disp: 30 tablet, Rfl: 0 .  cyclobenzaprine (FLEXERIL) 10 MG tablet, Take 10 mg by mouth 3 (three) times daily as needed for  muscle spasms., Disp: , Rfl:  .  dipyridamole-aspirin (AGGRENOX) 200-25 MG 12hr capsule, TAKE ONE CAPSULE BY MOUTH DAILY FOR 30 DAYS, Disp: 60 capsule, Rfl: 0 .  lisinopril (PRINIVIL,ZESTRIL) 20 MG tablet, TAKE 1 TABLET BY MOUTH DAILY, Disp: 30 tablet, Rfl: 0 .  QUEtiapine (SEROQUEL) 50 MG tablet, TAKE 1 TABLET BY MOUTH EVERY NIGHT AT BEDTIME, Disp: 30 tablet, Rfl: 0 .  Vitamin D, Ergocalciferol, (DRISDOL) 1.25 MG (50000 UT) CAPS capsule, TAKE 1 CAPSULE BY MOUTH 1 TIME A WEEK, Disp: 4 capsule, Rfl: 0   ALLERGIES:   Allergies  Allergen Reactions  . Penicillins     REVIEW OF SYSTEMS:  Review of Systems  Constitutional: Negative for fever.  HENT: Negative for ear pain.   Eyes: Negative for pain and visual disturbance.  Respiratory: Negative for chest tightness and shortness of breath.   Cardiovascular: Negative for leg swelling.  Gastrointestinal: Negative for abdominal pain.  Endocrine: Positive for cold intolerance and heat intolerance.  Genitourinary: Negative for pelvic pain.  Musculoskeletal: Positive for gait problem.  Skin: Negative for rash.  Allergic/Immunologic: Negative for food allergies.  Neurological: Negative for dizziness.  Hematological: Does not bruise/bleed easily.  Psychiatric/Behavioral: Negative for sleep disturbance.    FAMILY HISTORY:   Family History  Problem Relation Age of Onset  . Alcohol abuse Father     SOCIAL HISTORY:   Social History   Occupational History  .  Not on file  Tobacco Use  . Smoking status: Current Every Day Smoker    Packs/day: 0.50    Years: 15.00    Pack years: 7.50    Types: Cigarettes  . Smokeless tobacco: Current User  Substance and Sexual Activity  . Alcohol use: Not Currently  . Drug use: Yes    Frequency: 2.0 times per week    Types: Cocaine    Comment: occassional  . Sexual activity: Not on file     EXAMINATION:  Vital signs in last 24 hours: BP 109/73 (BP Location: Left Arm, Patient Position: Sitting, Cuff  Size: Small)   Pulse 77   Resp 18   Ht 5\' 3"  (1.6 m)   Wt 153 lb (69.4 kg)   BMI 27.10 kg/m    Physical Exam Constitutional:      Appearance: Normal appearance.  HENT:     Head: Normocephalic and atraumatic.     Right Ear: External ear normal.     Left Ear: External ear normal.     Nose: Nose normal.     Mouth/Throat:     Mouth: Mucous membranes are moist.     Pharynx: No oropharyngeal exudate.  Eyes:     Extraocular Movements: Extraocular movements intact.     Conjunctiva/sclera: Conjunctivae normal.     Pupils: Pupils are equal, round, and reactive to light.  Cardiovascular:     Rate and Rhythm: Normal rate and regular rhythm.     Pulses: Normal pulses.     Heart sounds: Normal heart sounds.  Pulmonary:     Effort: Pulmonary effort is normal.     Breath sounds: Normal breath sounds.  Abdominal:     General: Abdomen is flat. Bowel sounds are normal.     Palpations: Abdomen is soft. There is no mass.  Skin:    General: Skin is warm and dry.  Neurological:     General: No focal deficit present.     Mental Status: She is alert and oriented to person, place, and time.  Psychiatric:        Mood and Affect: Mood normal.        Behavior: Behavior normal.        Thought Content: Thought content normal.        Judgment: Judgment normal.    Ortho Exam  Left knee today reveals a trace to mild effusion.  No warmth erythema.  She ranges from almost full extension to only about 90 to 95 degrees of flexion.  She does have a little bit of opening with valgus stress but an excellent endpoint.  No opening with varus stress.  Presents with range of motion.    Imaging Review Plain radiographs demonstrate moderate degenerative joint disease of the left knee. The overall alignment is neutral. The bone quality appears to be good for age and reported activity level.  ASSESSMENT: End stage arthritis, left knee  Past Medical History:  Diagnosis Date  . Heart attack ([place]) 2004  .  Hypertension   . Stroke [facility]) 2015  H/O recent cocaine use On Aggrenox  PLAN: Plan for left total knee replacement.  The patient history, physical examination and imaging studies are consistent with advanced degenerative joint disease of the left knee. The patient has failed conservative treatment.  The clearance notes were reviewed.  After discussion with the patient it was felt that Total Knee Replacement was indicated. The procedure,  risks, and benefits of total knee arthroplasty were presented and reviewed. The  risks including but not limited to aseptic loosening, infection, blood clots, vascular and nerve injury, stiffness, patella tracking problems and fracture complications among others were discussed. The patient acknowledged the explanation, agreed to proceed with total knee replacement.  Anticipated LOS equal to or greater than 2 midnights due to - Age 61 and older with one or more of the following:  - Obesity  - Expected need for hospital services (PT, OT, Nursing) required for safe  discharge  - Anticipated need for postoperative skilled nursing care or inpatient rehab  - Active co-morbidities: Heart Attack and Stroke OR   - Unanticipated findings during/Post Surgery: None  - Patient is a high risk of re-admission due to: None   Aaron Edelman D. Cam[place], [place] [phone]  10/03/2018 9:32 AM

## 2018-10-03 NOTE — Progress Notes (Signed)
Office Visit Note   Patient: Judy Martinez           Date of Birth: [birth year]           MRN: [id number] Visit Date: 10/03/2018              Requested by: Shelby Mattocks, PA-C [address] [place], [place] [address] PCP: Shelby Mattocks, PA-C   CHIEF COMPLAINT:  Painful left Knee  HISTORY: Patient is a 68 y.o. female presented with a history of pain in the left knee for 2 years. Onset of symptoms was gradual starting 2 years ago with gradually worsening course since that time. Prior procedures on the knee are none. Patient has been treated conservatively with over-the-counter NSAIDs and activity modification. Patient currently rates pain in the knee at 8 out of 10 with activity. There is pain at night. present. none They have been previously treated with: NSAIDS: NSAID with mild improvement, still taking  Knee injection with corticosteroid  was performed Knee injection with visco supplementation was performed Medications: Aleve with mild improvement  PAST MEDICAL HISTORY: Patient Active Problem List   Diagnosis Date Noted  . Elevated liver enzymes 09/25/2018  . Cocaine use 09/25/2018  . Smoker unmotivated to quit 09/25/2018  . Chronic bilateral low back pain with sciatica 08/28/2018  . Chronic pain of left knee 08/28/2018  . Alcohol abuse   . Chronic insomnia   . Hyperlipidemia   . Essential hypertension   . History of CVA (cerebrovascular accident)    Past Medical History:  Diagnosis Date  . Heart attack ([place]) 2004  . Hypertension   . Stroke [facility]) 2015   Past Surgical History:  Procedure Laterality Date  . ABDOMINAL HYSTERECTOMY    . TUBAL LIGATION       MEDICATIONS PRIOR TO ADMISSION: No current facility-administered medications for this encounter.   Current Outpatient Medications:  .  atorvastatin (LIPITOR) 40 MG tablet, TAKE 1 TABLET BY MOUTH DAILY, Disp: 30 tablet, Rfl: 0 .  cyclobenzaprine (FLEXERIL) 10 MG tablet,  Take 10 mg by mouth 3 (three) times daily as needed for muscle spasms., Disp: , Rfl:  .  dipyridamole-aspirin (AGGRENOX) 200-25 MG 12hr capsule, TAKE ONE CAPSULE BY MOUTH DAILY FOR 30 DAYS, Disp: 60 capsule, Rfl: 0 .  lisinopril (PRINIVIL,ZESTRIL) 20 MG tablet, TAKE 1 TABLET BY MOUTH DAILY, Disp: 30 tablet, Rfl: 0 .  QUEtiapine (SEROQUEL) 50 MG tablet, TAKE 1 TABLET BY MOUTH EVERY NIGHT AT BEDTIME, Disp: 30 tablet, Rfl: 0 .  Vitamin D, Ergocalciferol, (DRISDOL) 1.25 MG (50000 UT) CAPS capsule, TAKE 1 CAPSULE BY MOUTH 1 TIME A WEEK, Disp: 4 capsule, Rfl: 0   ALLERGIES:   Allergies  Allergen Reactions  . Penicillins     REVIEW OF SYSTEMS:  Review of Systems  Constitutional: Negative for fever.  HENT: Negative for ear pain.   Eyes: Negative for pain and visual disturbance.  Respiratory: Negative for chest tightness and shortness of breath.   Cardiovascular: Negative for leg swelling.  Gastrointestinal: Negative for abdominal pain.  Endocrine: Positive for cold intolerance and heat intolerance.  Genitourinary: Negative for pelvic pain.  Musculoskeletal: Positive for gait problem.  Skin: Negative for rash.  Allergic/Immunologic: Negative for food allergies.  Neurological: Negative for dizziness.  Hematological: Does not bruise/bleed easily.  Psychiatric/Behavioral: Negative for sleep disturbance.    FAMILY HISTORY:   Family History  Problem Relation Age of Onset  . Alcohol abuse Father     SOCIAL HISTORY:  Social History   Occupational History  . Not on file  Tobacco Use  . Smoking status: Current Every Day Smoker    Packs/day: 0.50    Years: 15.00    Pack years: 7.50    Types: Cigarettes  . Smokeless tobacco: Current User  Substance and Sexual Activity  . Alcohol use: Not Currently  . Drug use: Yes    Frequency: 2.0 times per week    Types: Cocaine    Comment: occassional  . Sexual activity: Not on file     EXAMINATION:  Vital signs in last 24 hours: BP 109/73  (BP Location: Left Arm, Patient Position: Sitting, Cuff Size: Small)   Pulse 77   Resp 18   Ht 5\' 3"  (1.6 m)   Wt 153 lb (69.4 kg)   BMI 27.10 kg/m    Physical Exam Constitutional:      Appearance: Normal appearance.  HENT:     Head: Normocephalic and atraumatic.     Right Ear: External ear normal.     Left Ear: External ear normal.     Nose: Nose normal.     Mouth/Throat:     Mouth: Mucous membranes are moist.     Pharynx: No oropharyngeal exudate.  Eyes:     Extraocular Movements: Extraocular movements intact.     Conjunctiva/sclera: Conjunctivae normal.     Pupils: Pupils are equal, round, and reactive to light.  Cardiovascular:     Rate and Rhythm: Normal rate and regular rhythm.     Pulses: Normal pulses.     Heart sounds: Normal heart sounds.  Pulmonary:     Effort: Pulmonary effort is normal.     Breath sounds: Normal breath sounds.  Abdominal:     General: Abdomen is flat. Bowel sounds are normal.     Palpations: Abdomen is soft. There is no mass.  Skin:    General: Skin is warm and dry.  Neurological:     General: No focal deficit present.     Mental Status: She is alert and oriented to person, place, and time.  Psychiatric:        Mood and Affect: Mood normal.        Behavior: Behavior normal.        Thought Content: Thought content normal.        Judgment: Judgment normal.    Ortho Exam  Left knee today reveals a trace to mild effusion.  No warmth erythema.  She ranges from almost full extension to only about 90 to 95 degrees of flexion.  She does have a little bit of opening with valgus stress but an excellent endpoint.  No opening with varus stress.  Presents with range of motion.    Imaging Review Plain radiographs demonstrate moderate degenerative joint disease of the left knee. The overall alignment is neutral. The bone quality appears to be good for age and reported activity level.  ASSESSMENT: End stage arthritis, left knee  Past Medical  History:  Diagnosis Date  . Heart attack ([place]) 2004  . Hypertension   . Stroke [facility]) 2015  H/O recent cocaine use On Aggrenox  PLAN: Plan for left total knee replacement.  The patient history, physical examination and imaging studies are consistent with advanced degenerative joint disease of the left knee. The patient has failed conservative treatment.  The clearance notes were reviewed.  After discussion with the patient it was felt that Total Knee Replacement was indicated. The procedure,  risks, and benefits of  total knee arthroplasty were presented and reviewed. The risks including but not limited to aseptic loosening, infection, blood clots, vascular and nerve injury, stiffness, patella tracking problems and fracture complications among others were discussed. The patient acknowledged the explanation, agreed to proceed with total knee replacement.  Anticipated LOS equal to or greater than 2 midnights due to - Age 42 and older with one or more of the following:  - Obesity  - Expected need for hospital services (PT, OT, Nursing) required for safe  discharge  - Anticipated need for postoperative skilled nursing care or inpatient rehab  - Active co-morbidities: Heart Attack and Stroke OR   - Unanticipated findings during/Post Surgery: None  - Patient is a high risk of re-admission due to: None   Face-to-face time spent with patient was greater than 40 minutes.  Greater than 50% of the time was spent in counseling and coordination of care.  Mike Craze Parn[place], [place] [phone]  10/03/2018 9:32 AM

## 2018-10-04 ENCOUNTER — Telehealth (INDEPENDENT_AMBULATORY_CARE_PROVIDER_SITE_OTHER): Payer: Self-pay | Admitting: Orthopaedic Surgery

## 2018-10-04 NOTE — Telephone Encounter (Signed)
Patient called stating Judy Martinez referred her to see a cardiologist, but they don't have any appointments available until after 10/22/18.  Patient states she is scheduled for surgery on 10/22/18.  Patient is requesting another referral to see a cardiologist that can see her before her surgery date.

## 2018-10-04 NOTE — Telephone Encounter (Signed)
Patient advised to contact the her PCP regarding referral as they are the one who referred her to the cardiologist. Patient advised that her surgery may have to be pushed back until she gets clearance which we do not have yet. Patient verbalaized understanding. Marissa spoke with Myer Haff and the surgery will be cancelled until the clearance from cariology has been received.

## 2018-10-05 LAB — THC,MS,WB/SP RFX
CANNABINOID CONFIRMATION: POSITIVE
Cannabidiol: NEGATIVE ng/mL
Cannabinol: NEGATIVE ng/mL
Carboxy-THC: 7.3 ng/mL
Hydroxy-THC: NEGATIVE ng/mL
Tetrahydrocannabinol(THC): NEGATIVE ng/mL

## 2018-10-05 LAB — CBC
HEMATOCRIT: 34.3 % (ref 34.0–46.6)
Hemoglobin: 11.1 g/dL (ref 11.1–15.9)
MCH: 30.7 pg (ref 26.6–33.0)
MCHC: 32.4 g/dL (ref 31.5–35.7)
MCV: 95 fL (ref 79–97)
Platelets: 359 10*3/uL (ref 150–450)
RBC: 3.62 x10E6/uL — ABNORMAL LOW (ref 3.77–5.28)
RDW: 13.2 % (ref 11.7–15.4)
WBC: 6.3 10*3/uL (ref 3.4–10.8)

## 2018-10-05 LAB — CMP14 + ANION GAP
ALT: 19 IU/L (ref 0–32)
AST: 21 IU/L (ref 0–40)
Albumin/Globulin Ratio: 1.8 (ref 1.2–2.2)
Albumin: 4.5 g/dL (ref 3.6–4.8)
Alkaline Phosphatase: 73 IU/L (ref 39–117)
Anion Gap: 18 mmol/L (ref 10.0–18.0)
BUN/Creatinine Ratio: 11 — ABNORMAL LOW (ref 12–28)
BUN: 11 mg/dL (ref 8–27)
Bilirubin Total: 0.2 mg/dL (ref 0.0–1.2)
CO2: 20 mmol/L (ref 20–29)
Calcium: 9.8 mg/dL (ref 8.7–10.3)
Chloride: 108 mmol/L — ABNORMAL HIGH (ref 96–106)
Creatinine, Ser: 1 mg/dL (ref 0.57–1.00)
GFR calc Af Amer: 68 mL/min/{1.73_m2} (ref >59)
GFR calc non Af Amer: 59 mL/min/{1.73_m2} — ABNORMAL LOW (ref >59)
Globulin, Total: 2.5 g/dL (ref 1.5–4.5)
Glucose: 72 mg/dL (ref 65–99)
Potassium: 3.7 mmol/L (ref 3.5–5.2)
Sodium: 146 mmol/L — ABNORMAL HIGH (ref 134–144)
Total Protein: 7 g/dL (ref 6.0–8.5)

## 2018-10-05 LAB — DRUG SCREEN 13 W/CONF, WB
Amphetamines, IA: NEGATIVE ng/mL
Barbiturates, IA: NEGATIVE ug/mL
Benzodiazepines, IA: NEGATIVE ng/mL
Cocaine/Metabolite, IA: POSITIVE ng/mL — AB
Fentanyl, IA: NEGATIVE ng/mL
METHADONE, IA: NEGATIVE ng/mL
Meperidine, IA: NEGATIVE ng/mL
Opiates, IA: NEGATIVE ng/mL
Oxycodones, IA: NEGATIVE ng/mL
PHENCYCLIDINE, IA: NEGATIVE ng/mL
Propoxyphene, IA: NEGATIVE ng/mL
THC (Marijuana) Mtb, IA: POSITIVE ng/mL — AB
TRAMADOL, IA: NEGATIVE ng/mL

## 2018-10-05 LAB — COCAINE,MS,WB/SP RFX
Cocaine Confirmation: POSITIVE
Cocaine: 20 ng/mL

## 2018-10-05 LAB — GAMMA GT: GGT: 55 IU/L (ref 0–60)

## 2018-10-07 NOTE — Progress Notes (Signed)
Cardiology Office Note   Date:  10/09/2018   ID:  Judy Martinez, DOB August 30, 1951, MRN [id number]  PCP:  Shelby Mattocks, PA-C  Cardiologist:   Jenkins Rouge, MD   No chief complaint on file.     History of Present Illness: Judy Martinez is a 68 y.o. female who presents for consultation regarding pre operative clearance Needs left TKR with Dr Durward Fortes She has history of cocaine abuse alcohol abuse , HTN , smoking and HLD PMH indicates stroke in 2015 and heart attack 2004  She denies active ETOH but still smokes cocaine.  No previous anesthetic issues with tubal ligation Dr Jossie Ng saw patient on 09/25/18 and would not clear patient due to abnormal ECG   Her ECG shows nonspecific changes. 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 Heart attack ([place]) 2004  . Hypertension   . Stroke [facility]) 2015    Past Surgical History:  Procedure Laterality Date  . ABDOMINAL HYSTERECTOMY    . TUBAL LIGATION       Current Outpatient Medications  Medication Sig Dispense Refill  . aspirin EC 81 MG tablet Take 81 mg by mouth daily.    Marland Kitchen atorvastatin (LIPITOR) 40 MG tablet TAKE 1 TABLET BY MOUTH DAILY 30 tablet 0  . cyclobenzaprine (FLEXERIL) 10 MG tablet Take 10 mg by mouth 3 (three) times daily as needed for muscle spasms.    Marland Kitchen dipyridamole-aspirin (AGGRENOX) 200-25 MG 12hr capsule TAKE ONE CAPSULE BY MOUTH DAILY FOR 30 DAYS 60 capsule 0  . lisinopril (PRINIVIL,ZESTRIL) 20 MG tablet TAKE 1 TABLET BY MOUTH DAILY 30 tablet 0  . QUEtiapine (SEROQUEL) 50 MG tablet TAKE 1 TABLET BY MOUTH EVERY NIGHT AT BEDTIME 30 tablet 0  . Vitamin D,  Ergocalciferol, (DRISDOL) 1.25 MG (50000 UT) CAPS capsule TAKE 1 CAPSULE BY MOUTH 1 TIME A WEEK 4 capsule 0   No current facility-administered medications for this visit.     Allergies:   Penicillins    Social History:  The patient  reports that she has been smoking cigarettes. She has a 7.50 pack-year smoking history. She has never used smokeless tobacco. She reports previous alcohol use. She reports current drug use. Frequency: 2.00 times per week. Drug: Cocaine.   Family History:  The patient's family history includes Alcohol abuse in her father.    ROS:  Please see the history of present illness.   Otherwise, review of systems are positive for none.   All other systems are reviewed and negative.    PHYSICAL EXAM: VS:  BP 122/76   Pulse 100   Ht 5\' 3"  (1.6 m)   Wt 150 lb 12.8 oz (68.4 kg)   SpO2 98%   BMI 26.71 kg/m  , BMI Body mass index is 26.71 kg/m. Affect appropriate Chronically ill black female  HEENT: normal Neck supple with no adenopathy JVP normal no bruits no thyromegaly Lungs Rhonchi no wheezing and good diaphragmatic motion Heart:  S1/S2 no murmur, no rub, gallop or click PMI normal Abdomen: benighn, BS positve, no tenderness, no AAA no bruit.  No HSM or HJR  Distal pulses intact with no bruits No edema Neuro non-focal Skin warm and dry Left knee arthritis     EKG:  SR rate 85 nonspecific ST changes    Recent Labs: 09/25/2018: ALT 19; BUN 11; Creatinine, Ser 1.00; Hemoglobin 11.1; Platelets 359; Potassium 3.7; Sodium 146    Lipid Panel    Component Value Date/Time   CHOL 241 (H) 07/30/2018 1119   TRIG 179 (H) 07/30/2018 1119   HDL 57 07/30/2018 1119   CHOLHDL 4.2 07/30/2018 1119   LDLCALC 148 (H) 07/30/2018 1119      Wt Readings from Last 3 Encounters:  10/09/18 150 lb 12.8 oz (68.4 kg)  10/03/18 153 lb (69.4 kg)  09/25/18 150 lb 12.8 oz (68.4 kg)      Other studies Reviewed: Additional studies/ records that were reviewed today  include: notes from primary labs and ECG .    ASSESSMENT AND PLAN:  1.  Preoperative Clearance :  Clear to have surgery with no further testing Doubt she has had previous MI/Stroke ECG shows no MI and she has no neurologic deficits Biggest issues is ongoing cocaine use. Her lung exam suggests COPD but CXR done 09/25/18 showed NAD.  2. HLD:  Continue statin  3. Behavioral : continue Seroquel mood seems appropriate    Current medicines are reviewed at length with the patient today.  The patient does not have concerns regarding medicines.  The following changes have been made:  no change  Labs/ tests ordered today include: none   Orders Placed This Encounter  Procedures  . EKG 12-Lead     Disposition:   FU with cardiology PRN      Signed, Jenkins Rouge, MD  10/09/2018 3:12 PM    [facility] [address], [place], [place]  [address] Phone: [phone]; Fax: [phone]

## 2018-10-08 ENCOUNTER — Telehealth: Payer: Self-pay | Admitting: Internal Medicine

## 2018-10-08 ENCOUNTER — Encounter: Payer: Self-pay | Admitting: Cardiovascular Disease

## 2018-10-08 ENCOUNTER — Other Ambulatory Visit: Payer: Self-pay | Admitting: Internal Medicine

## 2018-10-08 NOTE — Progress Notes (Unsigned)
Please fax labs to ortho

## 2018-10-08 NOTE — Telephone Encounter (Signed)
Marissa from [facility] called wanting to talk with me on 1/23 at which time I left a message, and again today saying she may call back and leave detail message at ext [phone], and I also would try to call her back during my lunch.

## 2018-10-09 ENCOUNTER — Encounter: Payer: Self-pay | Admitting: Cardiovascular Disease

## 2018-10-09 ENCOUNTER — Ambulatory Visit (INDEPENDENT_AMBULATORY_CARE_PROVIDER_SITE_OTHER): Payer: Medicare HMO | Admitting: Cardiovascular Disease

## 2018-10-09 VITALS — BP 122/76 | HR 100 | Ht 63.0 in | Wt 150.8 lb

## 2018-10-09 DIAGNOSIS — R918 Other nonspecific abnormal finding of lung field: Secondary | ICD-10-CM | POA: Diagnosis not present

## 2018-10-09 DIAGNOSIS — F17200 Nicotine dependence, unspecified, uncomplicated: Secondary | ICD-10-CM | POA: Diagnosis not present

## 2018-10-09 DIAGNOSIS — Z01818 Encounter for other preprocedural examination: Secondary | ICD-10-CM | POA: Diagnosis not present

## 2018-10-09 DIAGNOSIS — R69 Illness, unspecified: Secondary | ICD-10-CM | POA: Diagnosis not present

## 2018-10-09 NOTE — Patient Instructions (Addendum)
Medication Instructions:   If you need a refill on your cardiac medications before your next appointment, please call your pharmacy.   Lab work:  If you have labs (blood work) drawn today and your tests are completely normal, you will receive your results only by: . MyChart Message (if you have MyChart) OR . A paper copy in the mail If you have any lab test that is abnormal or we need to change your treatment, we will call you to review the results.  Testing/Procedures: None ordered today.  Follow-Up: At [facility], you and your health needs are our priority.  As part of our continuing mission to provide you with exceptional heart care, we have created designated Provider Care Teams.  These Care Teams include your primary Cardiologist (physician) and Advanced Practice Providers (APPs -  Physician Assistants and Nurse Practitioners) who all work together to provide you with the care you need, when you need it. Your physician recommends that you schedule a follow-up appointment as needed with Dr. Nishan.   

## 2018-10-15 ENCOUNTER — Encounter: Payer: Self-pay | Admitting: Nurse Practitioner

## 2018-10-15 ENCOUNTER — Ambulatory Visit: Payer: Medicare HMO | Attending: Nurse Practitioner | Admitting: Nurse Practitioner

## 2018-10-15 VITALS — BP 110/72 | HR 92 | Temp 98.0°F | Ht 64.0 in | Wt 149.6 lb

## 2018-10-15 DIAGNOSIS — F17210 Nicotine dependence, cigarettes, uncomplicated: Secondary | ICD-10-CM | POA: Diagnosis not present

## 2018-10-15 DIAGNOSIS — F17200 Nicotine dependence, unspecified, uncomplicated: Secondary | ICD-10-CM

## 2018-10-15 DIAGNOSIS — E782 Mixed hyperlipidemia: Secondary | ICD-10-CM | POA: Diagnosis not present

## 2018-10-15 DIAGNOSIS — I10 Essential (primary) hypertension: Secondary | ICD-10-CM

## 2018-10-15 NOTE — Progress Notes (Signed)
Assessment & Plan:  Judy Martinez was seen today for new patient (initial visit).  Diagnoses and all orders for this visit:  Essential hypertension Continue all antihypertensives as prescribed.  Remember to bring in your blood pressure log with you for your follow up appointment.  DASH/Mediterranean Diets are healthier choices for HTN.    Mixed hyperlipidemia INSTRUCTIONS: Work on a low fat, heart healthy diet and participate in regular aerobic exercise program by working out at least 150 minutes per week; 5 days a week-30 minutes per day. Avoid red meat, fried foods. junk foods, sodas, sugary drinks, unhealthy snacking, alcohol and smoking.  Drink at least 48oz of water per day and monitor your carbohydrate intake daily.    Tobacco dependence Judy Martinez was counseled on the dangers of tobacco use, and was advised to quit. Reviewed strategies to maximize success, including removing cigarettes and smoking materials from environment, stress management and support of family/friends as well as pharmacological alternatives including: Wellbutrin, Chantix, Nicotine patch, Nicotine gum or lozenges. Smoking cessation support: smoking cessation hotline: 1-800-QUIT-NOW.  Smoking cessation classes are also available through [facility]. Call [phone] or visit our website at [email or web address]   A total of 3 minutes was spent on counseling for smoking cessation and Judy Martinez is not ready to quit.    Patient has been counseled on age-appropriate routine health concerns for screening and prevention. These are reviewed and up-to-date. Referrals have been placed accordingly. Immunizations are up-to-date or declined.    Subjective:   Chief Complaint  Patient presents with  . New Patient (Initial Visit)    Pt. is here to establish care.    HPI Judy Martinez 68 y.o. female presents to office today to establish care. She has a ppast medical history of cocaine abuse, alcohol abuse,  hypertension, tobacco dependence and hyperlipidemia.   She continues with ongoing cocaine abuse. Takes Flexeril for bilateral lower extremity muscle spasms. She can not recall why she takes seroquel 50mg  daily. I am unable to locate any notes in her chart with a diagnosis for the seroquel only refills by her previous PCP.   Essential Hypertension Blood pressure is well controlled. She endorses medication compliance taking lisinopril 20mg  daily. Denies chest pain, shortness of breath, palpitations, lightheadedness, dizziness, headaches or BLE edema.  BP Readings from Last 3 Encounters:  10/15/18 110/72  10/09/18 122/76  10/03/18 109/73    Hyperlipidemia Patient presents for follow up to hyperlipidemia. Poorly controlled.  She is medication compliant taking lipitor 40mg  daily. She is not diet or exercise compliant and denies statin intolerance including myalgias.  Lab Results  Component Value Date   CHOL 241 (H) 07/30/2018   Lab Results  Component Value Date   HDL 57 07/30/2018   Lab Results  Component Value Date   LDLCALC 148 (H) 07/30/2018   Lab Results  Component Value Date   TRIG 179 (H) 07/30/2018   Lab Results  Component Value Date   CHOLHDL 4.2 07/30/2018   Review of Systems  Constitutional: Negative for fever, malaise/fatigue and weight loss.  HENT: Negative.  Negative for nosebleeds.   Eyes: Negative.  Negative for blurred vision, double vision and photophobia.  Respiratory: Negative.  Negative for cough and shortness of breath.   Cardiovascular: Negative.  Negative for chest pain, palpitations and leg swelling.  Gastrointestinal: Negative.  Negative for heartburn, nausea and vomiting.  Musculoskeletal: Negative.  Negative for myalgias.  Neurological: Negative.  Negative for dizziness, focal weakness, seizures and headaches.  Psychiatric/Behavioral: Negative.  Negative for suicidal ideas.    Past Medical History:  Diagnosis Date  . Heart attack ([place]) 2004  .  Hypertension   . Stroke [facility]) 2015    Past Surgical History:  Procedure Laterality Date  . ABDOMINAL HYSTERECTOMY    . TUBAL LIGATION      Family History  Problem Relation Age of Onset  . Alcohol abuse Father     Social History Reviewed with no changes to be made today.   Outpatient Medications Prior to Visit  Medication Sig Dispense Refill  . aspirin EC 81 MG tablet Take 81 mg by mouth daily.    Marland Kitchen atorvastatin (LIPITOR) 40 MG tablet TAKE 1 TABLET BY MOUTH DAILY 30 tablet 0  . cyclobenzaprine (FLEXERIL) 10 MG tablet Take 10 mg by mouth 3 (three) times daily as needed for muscle spasms.    Marland Kitchen dipyridamole-aspirin (AGGRENOX) 200-25 MG 12hr capsule TAKE ONE CAPSULE BY MOUTH DAILY FOR 30 DAYS 60 capsule 0  . lisinopril (PRINIVIL,ZESTRIL) 20 MG tablet TAKE 1 TABLET BY MOUTH DAILY 30 tablet 0  . QUEtiapine (SEROQUEL) 50 MG tablet TAKE 1 TABLET BY MOUTH EVERY NIGHT AT BEDTIME 30 tablet 0  . Vitamin D, Ergocalciferol, (DRISDOL) 1.25 MG (50000 UT) CAPS capsule TAKE 1 CAPSULE BY MOUTH 1 TIME A WEEK 4 capsule 0   No facility-administered medications prior to visit.     Allergies  Allergen Reactions  . Penicillins        Objective:    BP 110/72 (BP Location: Left Arm, Patient Position: Sitting, Cuff Size: Normal)   Pulse 92   Temp 98 F (36.7 C) (Oral)   Ht 5\' 4"  (1.626 m)   Wt 149 lb 9.6 oz (67.9 kg)   SpO2 99%   BMI 25.68 kg/m  Wt Readings from Last 3 Encounters:  10/15/18 149 lb 9.6 oz (67.9 kg)  10/09/18 150 lb 12.8 oz (68.4 kg)  10/03/18 153 lb (69.4 kg)    Physical Exam Vitals signs and nursing note reviewed.  Constitutional:      Appearance: She is well-developed.  HENT:     Head: Normocephalic and atraumatic.  Neck:     Musculoskeletal: Normal range of motion.  Cardiovascular:     Rate and Rhythm: Normal rate and regular rhythm.     Heart sounds: Normal heart sounds. No murmur. No friction rub. No gallop.   Pulmonary:     Effort: Pulmonary effort is normal.  No tachypnea or respiratory distress.     Breath sounds: Normal breath sounds. No decreased breath sounds, wheezing, rhonchi or rales.  Chest:     Chest wall: No tenderness.  Abdominal:     General: Bowel sounds are normal.     Palpations: Abdomen is soft.  Musculoskeletal: Normal range of motion.  Skin:    General: Skin is warm and dry.  Neurological:     Mental Status: She is alert and oriented to person, place, and time.     Coordination: Coordination normal.  Psychiatric:        Behavior: Behavior normal. Behavior is cooperative.        Thought Content: Thought content normal.        Judgment: Judgment normal.          Patient has been counseled extensively about nutrition and exercise as well as the importance of adherence with medications and regular follow-up. The patient was given clear instructions to go to ER or return to medical center if symptoms don't improve, worsen  or new problems develop. The patient verbalized understanding.   Follow-up: No follow-ups on file.   Gildardo Pounds, FNP-BC [facility] [place], [place]   10/17/2018, 11:08 AM

## 2018-10-17 ENCOUNTER — Encounter: Payer: Self-pay | Admitting: Nurse Practitioner

## 2018-10-17 ENCOUNTER — Other Ambulatory Visit (INDEPENDENT_AMBULATORY_CARE_PROVIDER_SITE_OTHER): Payer: Self-pay | Admitting: Orthopedic Surgery

## 2018-10-17 DIAGNOSIS — F1990 Other psychoactive substance use, unspecified, uncomplicated: Secondary | ICD-10-CM

## 2018-10-22 ENCOUNTER — Ambulatory Visit: Admit: 2018-10-22 | Payer: Medicare HMO | Admitting: Orthopaedic Surgery

## 2018-10-22 SURGERY — ARTHROPLASTY, KNEE, TOTAL
Anesthesia: Choice | Laterality: Left

## 2018-10-29 ENCOUNTER — Encounter (INDEPENDENT_AMBULATORY_CARE_PROVIDER_SITE_OTHER): Payer: Self-pay | Admitting: Orthopedic Surgery

## 2018-10-29 ENCOUNTER — Ambulatory Visit (INDEPENDENT_AMBULATORY_CARE_PROVIDER_SITE_OTHER): Payer: 59 | Admitting: Orthopedic Surgery

## 2018-10-29 VITALS — BP 112/63 | HR 101 | Ht 63.5 in | Wt 152.0 lb

## 2018-10-29 DIAGNOSIS — M1712 Unilateral primary osteoarthritis, left knee: Secondary | ICD-10-CM | POA: Diagnosis not present

## 2018-10-29 NOTE — H&P (Addendum)
Joni Fears, MD   Biagio Borg, PA-C [address] [place], [place]  [address]                             [phone]   ORTHOPAEDIC HISTORY & PHYSICAL  Judy Martinez MRN:  [id number] DOB/SEX:  [birth year]/female  CHIEF COMPLAINT:  Painful left Knee  HISTORY: Patient is a 68 y.o. female presented with a history of pain in the left knee for 2 years. Onset of symptoms was gradual starting 2 years ago with gradually worsening course since that time. Prior procedures on the knee are none. Patient has been treated conservatively with over-the-counter NSAIDs and activity modification. Patient currently rates pain in the knee at 8 out of 10 with activity. There is pain at night. present. none They have been previously treated with: NSAIDS: NSAID with mild improvement, still taking  Knee injection with corticosteroid  was performed Knee injection with visco supplementation was performed Medications: Aleve with mild improvement  PAST MEDICAL HISTORY:     Patient Active Problem List   Diagnosis Date Noted  . Elevated liver enzymes 09/25/2018  . Cocaine use 09/25/2018  . Smoker unmotivated to quit 09/25/2018  . Chronic bilateral low back pain with sciatica 08/28/2018  . Chronic pain of left knee 08/28/2018  . Alcohol abuse   . Chronic insomnia   . Hyperlipidemia   . Essential hypertension   . History of CVA (cerebrovascular accident)        Past Medical History:  Diagnosis Date  . Heart attack ([place]) 2004  . Hypertension   . Stroke [facility]) 2015        Past Surgical History:  Procedure Laterality Date  . ABDOMINAL HYSTERECTOMY    . TUBAL LIGATION       MEDICATIONS PRIOR TO ADMISSION: No current facility-administered medications for this encounter.   Current Outpatient Medications:  .  atorvastatin (LIPITOR) 40 MG tablet, TAKE 1 TABLET BY MOUTH DAILY, Disp: 30 tablet, Rfl: 0 .  cyclobenzaprine (FLEXERIL) 10 MG tablet, Take 10 mg by mouth 3  (three) times daily as needed for muscle spasms., Disp: , Rfl:  .  dipyridamole-aspirin (AGGRENOX) 200-25 MG 12hr capsule, TAKE ONE CAPSULE BY MOUTH DAILY FOR 30 DAYS, Disp: 60 capsule, Rfl: 0 .  lisinopril (PRINIVIL,ZESTRIL) 20 MG tablet, TAKE 1 TABLET BY MOUTH DAILY, Disp: 30 tablet, Rfl: 0 .  QUEtiapine (SEROQUEL) 50 MG tablet, TAKE 1 TABLET BY MOUTH EVERY NIGHT AT BEDTIME, Disp: 30 tablet, Rfl: 0 .  Vitamin D, Ergocalciferol, (DRISDOL) 1.25 MG (50000 UT) CAPS capsule, TAKE 1 CAPSULE BY MOUTH 1 TIME A WEEK, Disp: 4 capsule, Rfl: 0   ALLERGIES:       Allergies  Allergen Reactions  . Penicillins     REVIEW OF SYSTEMS:  Review of Systems  Constitutional: Negative for fever.  HENT: Negative for ear pain.   Eyes: Negative for pain and visual disturbance.  Respiratory: Negative for chest tightness and shortness of breath.   Cardiovascular: Negative for leg swelling.  Gastrointestinal: Negative for abdominal pain.  Endocrine: Positive for cold intolerance and heat intolerance.  Genitourinary: Negative for pelvic pain.  Musculoskeletal: Positive for gait problem.  Skin: Negative for rash.  Allergic/Immunologic: Negative for food allergies.  Neurological: Negative for dizziness.  Hematological: Does not bruise/bleed easily.  Psychiatric/Behavioral: Negative for sleep disturbance.    FAMILY HISTORY:        Family History  Problem Relation Age of Onset  .  Alcohol abuse Father     SOCIAL HISTORY:   Social History        Occupational History  . Not on file  Tobacco Use  . Smoking status: Current Every Day Smoker    Packs/day: 0.50    Years: 15.00    Pack years: 7.50    Types: Cigarettes  . Smokeless tobacco: Current User  Substance and Sexual Activity  . Alcohol use: Not Currently  . Drug use: Yes    Frequency: 2.0 times per week    Types: Cocaine    Comment: occassional  . Sexual activity: Not on file     EXAMINATION:  Vital signs in last  24 hours: BP 109/73 (BP Location: Left Arm, Patient Position: Sitting, Cuff Size: Small)   Pulse 77   Resp 18   Ht 5\' 3"  (1.6 m)   Wt 153 lb (69.4 kg)   BMI 27.10 kg/m    Physical Exam Constitutional:      Appearance: Normal appearance.  HENT:     Head: Normocephalic and atraumatic.     Right Ear: External ear normal.     Left Ear: External ear normal.     Nose: Nose normal.     Mouth/Throat:     Mouth: Mucous membranes are moist.     Pharynx: No oropharyngeal exudate.  Eyes:     Extraocular Movements: Extraocular movements intact.     Conjunctiva/sclera: Conjunctivae normal.     Pupils: Pupils are equal, round, and reactive to light.  Cardiovascular:     Rate and Rhythm: Normal rate and regular rhythm.     Pulses: Normal pulses.     Heart sounds: Normal heart sounds.  Pulmonary:     Effort: Pulmonary effort is normal.     Breath sounds: Normal breath sounds.  Abdominal:     General: Abdomen is flat. Bowel sounds are normal.     Palpations: Abdomen is soft. There is no mass.  Skin:    General: Skin is warm and dry.  Neurological:     General: No focal deficit present.     Mental Status: She is alert and oriented to person, place, and time.  Psychiatric:        Mood and Affect: Mood normal.        Behavior: Behavior normal.        Thought Content: Thought content normal.        Judgment: Judgment normal.    Ortho Exam  Left knee today reveals a trace to mild effusion.  No warmth erythema.  She ranges from almost full extension to only about 90 to 95 degrees of flexion.  She does have a little bit of opening with valgus stress but an excellent endpoint.  No opening with varus stress.  Presents with range of motion.    Imaging Review Plain radiographs demonstrate moderate degenerative joint disease of the left knee. The overall alignment is neutral. The bone quality appears to be good for age and reported activity level.  ASSESSMENT: End stage arthritis,  left knee      Past Medical History:  Diagnosis Date  . Heart attack ([place]) 2004  . Hypertension   . Stroke [facility]) 2015  H/O recent cocaine use On Aggrenox  PLAN: Plan for left total knee replacement.  The patient history, physical examination and imaging studies are consistent with advanced degenerative joint disease of the left knee. The patient has failed conservative treatment.  The clearance notes were reviewed.  After  discussion with the patient it was felt that Total Knee Replacement was indicated. The procedure,  risks, and benefits of total knee arthroplasty were presented and reviewed. The risks including but not limited to aseptic loosening, infection, blood clots, vascular and nerve injury, stiffness, patella tracking problems and fracture complications among others were discussed. The patient acknowledged the explanation, agreed to proceed with total knee replacement.  Anticipated LOS equal to or greater than 2 midnights due to - Age 37 and older with one or more of the following:             - Obesity             - Expected need for hospital services (PT, OT, Nursing) required for safe   discharge             - Anticipated need for postoperative skilled nursing care or inpatient rehab             - Active co-morbidities: Heart Attack and Stroke OR   - Unanticipated findings during/Post Surgery: None  - Patient is a high risk of re-admission due to: None   Aaron Edelman D. [place], [place] [phone]  10/29/2017

## 2018-10-29 NOTE — Progress Notes (Signed)
Judy Fears, MD   Biagio Borg, PA-C [address], [place], [place]  [address]                             [phone]   ORTHOPAEDIC HISTORY & PHYSICAL  Judy Martinez MRN:  [id number] DOB/SEX:  Aug 08, 1951/female CHIEF COMPLAINT:  Painful left Knee  HISTORY: Patient is a 68 y.o. female presented with a history of pain in the left knee for 2 years. Onset of symptoms was gradual starting 2 years ago with gradually worsening course since that time. Prior procedures on the knee are none. Patient has been treated conservatively with over-the-counter NSAIDs and activity modification. Patient currently rates pain in the knee at 8 out of 10 with activity. There is pain at night. present. none They have been previously treated with: NSAIDS: NSAID with mild improvement, still taking  Knee injection with corticosteroid  was performed Knee injection with visco supplementation was performed Medications: Aleve with mild improvement  PAST MEDICAL HISTORY:     Patient Active Problem List   Diagnosis Date Noted  . Elevated liver enzymes 09/25/2018  . Cocaine use 09/25/2018  . Smoker unmotivated to quit 09/25/2018  . Chronic bilateral low back pain with sciatica 08/28/2018  . Chronic pain of left knee 08/28/2018  . Alcohol abuse   . Chronic insomnia   . Hyperlipidemia   . Essential hypertension   . History of CVA (cerebrovascular accident)        Past Medical History:  Diagnosis Date  . Heart attack ([place]) 2004  . Hypertension   . Stroke [facility]) 2015        Past Surgical History:  Procedure Laterality Date  . ABDOMINAL HYSTERECTOMY    . TUBAL LIGATION       MEDICATIONS PRIOR TO ADMISSION: No current facility-administered medications for this encounter.   Current Outpatient Medications:  .  atorvastatin (LIPITOR) 40 MG tablet, TAKE 1 TABLET BY MOUTH DAILY, Disp: 30 tablet, Rfl: 0 .  cyclobenzaprine (FLEXERIL) 10 MG tablet, Take 10 mg by mouth 3 (three)  times daily as needed for muscle spasms., Disp: , Rfl:  .  dipyridamole-aspirin (AGGRENOX) 200-25 MG 12hr capsule, TAKE ONE CAPSULE BY MOUTH DAILY FOR 30 DAYS, Disp: 60 capsule, Rfl: 0 .  lisinopril (PRINIVIL,ZESTRIL) 20 MG tablet, TAKE 1 TABLET BY MOUTH DAILY, Disp: 30 tablet, Rfl: 0 .  QUEtiapine (SEROQUEL) 50 MG tablet, TAKE 1 TABLET BY MOUTH EVERY NIGHT AT BEDTIME, Disp: 30 tablet, Rfl: 0 .  Vitamin D, Ergocalciferol, (DRISDOL) 1.25 MG (50000 UT) CAPS capsule, TAKE 1 CAPSULE BY MOUTH 1 TIME A WEEK, Disp: 4 capsule, Rfl: 0   ALLERGIES:       Allergies  Allergen Reactions  . Penicillins     REVIEW OF SYSTEMS:  Review of Systems  Constitutional: Negative for fever.  HENT: Negative for ear pain.   Eyes: Negative for pain and visual disturbance.  Respiratory: Negative for chest tightness and shortness of breath.   Cardiovascular: Negative for leg swelling.  Gastrointestinal: Negative for abdominal pain.  Endocrine: Positive for cold intolerance and heat intolerance.  Genitourinary: Negative for pelvic pain.  Musculoskeletal: Positive for gait problem.  Skin: Negative for rash.  Allergic/Immunologic: Negative for food allergies.  Neurological: Negative for dizziness.  Hematological: Does not bruise/bleed easily.  Psychiatric/Behavioral: Negative for sleep disturbance.    FAMILY HISTORY:        Family History  Problem Relation Age of  Onset  . Alcohol abuse Father     SOCIAL HISTORY:   Social History        Occupational History  . Not on file  Tobacco Use  . Smoking status: Current Every Day Smoker    Packs/day: 0.50    Years: 15.00    Pack years: 7.50    Types: Cigarettes  . Smokeless tobacco: Current User  Substance and Sexual Activity  . Alcohol use: Not Currently  . Drug use: Yes    Frequency: 2.0 times per week    Types: Cocaine    Comment: occassional  . Sexual activity: Not on file     EXAMINATION:  Vital signs in last 24  hours: BP 109/73 (BP Location: Left Arm, Patient Position: Sitting, Cuff Size: Small)   Pulse 77   Resp 18   Ht 5\' 3"  (1.6 m)   Wt 153 lb (69.4 kg)   BMI 27.10 kg/m    Physical Exam Constitutional:      Appearance: Normal appearance.  HENT:     Head: Normocephalic and atraumatic.     Right Ear: External ear normal.     Left Ear: External ear normal.     Nose: Nose normal.     Mouth/Throat:     Mouth: Mucous membranes are moist.     Pharynx: No oropharyngeal exudate.  Eyes:     Extraocular Movements: Extraocular movements intact.     Conjunctiva/sclera: Conjunctivae normal.     Pupils: Pupils are equal, round, and reactive to light.  Cardiovascular:     Rate and Rhythm: Normal rate and regular rhythm.     Pulses: Normal pulses.     Heart sounds: Normal heart sounds.  Pulmonary:     Effort: Pulmonary effort is normal.     Breath sounds: Normal breath sounds.  Abdominal:     General: Abdomen is flat. Bowel sounds are normal.     Palpations: Abdomen is soft. There is no mass.  Skin:    General: Skin is warm and dry.  Neurological:     General: No focal deficit present.     Mental Status: She is alert and oriented to person, place, and time.  Psychiatric:        Mood and Affect: Mood normal.        Behavior: Behavior normal.        Thought Content: Thought content normal.        Judgment: Judgment normal.    Ortho Exam  Left knee today reveals a trace to mild effusion.  No warmth erythema.  She ranges from almost full extension to only about 90 to 95 degrees of flexion.  She does have a little bit of opening with valgus stress but an excellent endpoint.  No opening with varus stress.  Presents with range of motion.    Imaging Review Plain radiographs demonstrate moderate degenerative joint disease of the left knee. The overall alignment is neutral. The bone quality appears to be good for age and reported activity level.  ASSESSMENT: End stage arthritis, left  knee      Past Medical History:  Diagnosis Date  . Heart attack ([place]) 2004  . Hypertension   . Stroke [facility]) 2015  H/O recent cocaine use On Aggrenox  PLAN: Plan for left total knee replacement.  The patient history, physical examination and imaging studies are consistent with advanced degenerative joint disease of the left knee. The patient has failed conservative treatment.  The clearance notes were  reviewed.  After discussion with the patient it was felt that Total Knee Replacement was indicated. The procedure,  risks, and benefits of total knee arthroplasty were presented and reviewed. The risks including but not limited to aseptic loosening, infection, blood clots, vascular and nerve injury, stiffness, patella tracking problems and fracture complications among others were discussed. The patient acknowledged the explanation, agreed to proceed with total knee replacement.  Anticipated LOS equal to or greater than 2 midnights due to - Age 27 and older with one or more of the following:           - Obesity           - Expected need for hospital services (PT, OT, Nursing) required for safe          discharge           - Anticipated need for postoperative skilled nursing care or inpatient rehab           - Active co-morbidities: Heart Attack and Stroke OR   - Unanticipated findings during/Post Surgery: None  - Patient is a high risk of re-admission due to: None   Face-to-face time spent with patient was greater than 40 minutes.  Greater than 50% of the time was spent in counseling and coordination of care.  Mike Craze [place], [place] [phone]  10/03/2018 9:32 AM

## 2018-10-31 ENCOUNTER — Other Ambulatory Visit (INDEPENDENT_AMBULATORY_CARE_PROVIDER_SITE_OTHER): Payer: Self-pay | Admitting: Orthopedic Surgery

## 2018-10-31 ENCOUNTER — Other Ambulatory Visit (HOSPITAL_COMMUNITY): Payer: Self-pay | Admitting: *Deleted

## 2018-10-31 NOTE — Patient Instructions (Addendum)
Judy Martinez    Your procedure is scheduled on: 11-12-2018  Report to [facility] Main  Entrance  Report to admitting at 730 AM    Call this number if you have problems the morning of surgery [phone]    Remember: Do not eat food or drink liquids :After Midnight. BRUSH YOUR TEETH MORNING OF SURGERY AND RINSE YOUR MOUTH OUT, NO CHEWING GUM CANDY OR MINTS.   Call dr whitfield office Jackelyn Poling [phone] and ask about stopping aspirin and er dipyridamite   Take these medicines the morning of surgery with A SIP OF WATER: ATORVASTATIN              You may not have any metal on your body including hair pins and              piercings  Do not wear jewelry, make-up, lotions, powders or perfumes, deodorant             Do not wear nail polish.  Do not shave  48 hours prior to surgery.              Do not bring valuables to the hospital. [place].  Contacts, dentures or bridgework may not be worn into surgery.  Leave suitcase in the car. After surgery it may be brought to your room.     _________             [facility] - Preparing for Surgery Before surgery, you can play an important role.  Because skin is not sterile, your skin needs to be as free of germs as possible.  You can reduce the number of germs on your skin by washing with CHG (chlorahexidine gluconate) soap before surgery.  CHG is an antiseptic cleaner which kills germs and bonds with the skin to continue killing germs even after washing. Please DO NOT use if you have an allergy to CHG or antibacterial soaps.  If your skin becomes reddened/irritated stop using the CHG and inform your nurse when you arrive at Short Stay. Do not shave (including legs and underarms) for at least 48 hours prior to the first CHG shower.  You may shave your face/neck. Please follow these instructions carefully:  1.  Shower with CHG Soap the night before surgery and the   morning of Surgery.  2.  If you choose to wash your hair, wash your hair first as usual with your  normal  shampoo.  3.  After you shampoo, rinse your hair and body thoroughly to remove the  shampoo.                           4.  Use CHG as you would any other liquid soap.  You can apply chg directly  to the skin and wash                       Gently with a scrungie or clean washcloth.  5.  Apply the CHG Soap to your body ONLY FROM THE NECK DOWN.   Do not use on face/ open                           Wound or  open sores. Avoid contact with eyes, ears mouth and genitals (private parts).                       Wash face,  Genitals (private parts) with your normal soap.             6.  Wash thoroughly, paying special attention to the area where your surgery  will be performed.  7.  Thoroughly rinse your body with warm water from the neck down.  8.  DO NOT shower/wash with your normal soap after using and rinsing off  the CHG Soap.                9.  Pat yourself dry with a clean towel.            10.  Wear clean pajamas.            11.  Place clean sheets on your bed the night of your first shower and do not  sleep with pets. Day of Surgery : Do not apply any lotions/deodorants the morning of surgery.  Please wear clean clothes to the hospital/surgery center.  FAILURE TO FOLLOW THESE INSTRUCTIONS MAY RESULT IN THE CANCELLATION OF YOUR SURGERY PATIENT SIGNATURE_________________________________  NURSE SIGNATURE__________________________________  ________________________________________________________________________

## 2018-10-31 NOTE — Progress Notes (Signed)
NEED ORDERS IN Epic FOR 3-3- SURGERY

## 2018-11-02 ENCOUNTER — Other Ambulatory Visit: Payer: Self-pay | Admitting: Internal Medicine

## 2018-11-05 ENCOUNTER — Encounter (HOSPITAL_COMMUNITY)
Admission: RE | Admit: 2018-11-05 | Discharge: 2018-11-05 | Disposition: A | Discharge status: Home / Self Care | Payer: 59 | Source: Ambulatory Visit | Attending: Orthopaedic Surgery | Admitting: Orthopaedic Surgery

## 2018-11-05 ENCOUNTER — Encounter (HOSPITAL_COMMUNITY): Payer: Self-pay

## 2018-11-05 ENCOUNTER — Other Ambulatory Visit: Payer: Self-pay

## 2018-11-05 DIAGNOSIS — Z01812 Encounter for preprocedural laboratory examination: Secondary | ICD-10-CM | POA: Insufficient documentation

## 2018-11-05 HISTORY — DX: Unspecified osteoarthritis, unspecified site: M19.90

## 2018-11-05 LAB — BASIC METABOLIC PANEL
Anion gap: 6 (ref 5–15)
BUN: 22 mg/dL (ref 8–23)
CO2: 26 mmol/L (ref 22–32)
Calcium: 9.2 mg/dL (ref 8.9–10.3)
Chloride: 110 mmol/L (ref 98–111)
Creatinine, Ser: 1.06 mg/dL — ABNORMAL HIGH (ref 0.44–1.00)
GFR calc Af Amer: >60 mL/min (ref >60)
GFR calc non Af Amer: 55 mL/min — ABNORMAL LOW (ref >60)
Glucose, Bld: 88 mg/dL (ref 70–99)
Potassium: 3.5 mmol/L (ref 3.5–5.1)
Sodium: 142 mmol/L (ref 135–145)

## 2018-11-05 LAB — SURGICAL PCR SCREEN
MRSA, PCR: NEGATIVE
Staphylococcus aureus: NEGATIVE

## 2018-11-05 LAB — CBC
HCT: 35.4 % — ABNORMAL LOW (ref 36.0–46.0)
Hemoglobin: 10.9 g/dL — ABNORMAL LOW (ref 12.0–15.0)
MCH: 29.7 pg (ref 26.0–34.0)
MCHC: 30.8 g/dL (ref 30.0–36.0)
MCV: 96.5 fL (ref 80.0–100.0)
Platelets: 306 10*3/uL (ref 150–400)
RBC: 3.67 MIL/uL — ABNORMAL LOW (ref 3.87–5.11)
RDW: 13.1 % (ref 11.5–15.5)
WBC: 5.9 10*3/uL (ref 4.0–10.5)
nRBC: 0 % (ref 0.0–0.2)

## 2018-11-05 NOTE — Progress Notes (Signed)
CARDIAC CLEARANCE 10-09-18 DR [place] EKG 10-09-18 Epic CHEST XRAY 09-25-18 EPIC

## 2018-11-05 NOTE — Progress Notes (Signed)
Spoke with debbie at dr whitfield and made aware last cocaine use "few days ago" per patient. Debbie to make dr whitfield aware.

## 2018-11-05 NOTE — Progress Notes (Signed)
PCP: [facility]  CARDIOLOGIST:[place] IN Epic: CARDIAC CLEARANCE 10-09-18 DR Johnsie Cancel , CHEST XRAY 09-25-18, EKG 10-09-18 , PATIENT STATES LAST COCAINE USE " A FEW DAYS AGO" . SURGERY CANCELLED FEB 2020 DUE TO [place] 09-25-2018.  INFO ON CHART:NONE  BLOOD THINNERS AND LAST DOSES:PATIENT INSTRUCTED TO CALL DR WHITFIELD AND FIND OUT WHEN TO STOP AGGRENOX. ____________________________________  PATIENT SYMPTOMS AT TIME OF PREOP:

## 2018-11-05 NOTE — Progress Notes (Signed)
Patient instructed to call dr whitfield office and ask about stopping aspirin and er dipyridamie ( aggrenox). Patient instructed to not use cocaine anymore prior to surgery.

## 2018-11-07 ENCOUNTER — Telehealth (INDEPENDENT_AMBULATORY_CARE_PROVIDER_SITE_OTHER): Payer: Self-pay | Admitting: Orthopaedic Surgery

## 2018-11-07 ENCOUNTER — Encounter (HOSPITAL_COMMUNITY): Payer: Self-pay | Admitting: Anesthesiology

## 2018-11-07 ENCOUNTER — Encounter (HOSPITAL_COMMUNITY): Payer: Self-pay | Admitting: Physician Assistant

## 2018-11-07 ENCOUNTER — Other Ambulatory Visit: Payer: Self-pay

## 2018-11-07 ENCOUNTER — Telehealth: Payer: Self-pay

## 2018-11-07 NOTE — Progress Notes (Signed)
Anesthesia Chart Review   Case:  [id number] Date/Time:  11/12/18 0945   Procedure:  LEFT TOTAL KNEE ARTHROPLASTY (Left )   Anesthesia type:  Choice   Pre-op diagnosis:  left knee osteoarthritis   Location:  [facility] ROOM 05 / [facility]   Surgeon:  Garald Balding, MD      DISCUSSION: 68 yo current every day smoker (7.5 pack years) with h/o HTN, MI (2004), stroke (2015), reports drug use (cocaine, marijuana), left knee OA scheduled for above procedure 11/12/18 with Dr. Joni Fears.    At PAT visit 11/05/2018 pt reports cocaine use several days ago.  Surgeon made aware.  Pt understands the risk with cocaine use and was advised by PAT nurse to abstain prior to surgery. Surgeon, Cardiologist, and PCP have all documented discussing the risk of cocaine use with pt.  Per Dr. Rudene Anda scheduler a DOS drug screen will be ordered.   Pt seen by Cardiologist, Dr. Jenkins Rouge, on 10/09/18.  Per his note, "Clear to have surgery with no further testing. Doubt she has had previous MI/Stroke ECG shows no MI and she has no neurologic deficits. Biggest issues is ongoing cocaine use. Her lung exam suggests COPD but CXR done 09/25/18 showed NAD."   Pt advised by PCP to hold ASA 7 days prior to procedure.  Confirmed with Dr. Rudene Anda office that pt is aware of when to stop.   Pt can proceed with planned procedure barring acute status change.  VS: BP (!) 123/95   Pulse 88   Temp (!) 36.3 C (Oral)   Ht 5\' 3"  (1.6 m)   Wt 67.6 kg   SpO2 95%   BMI 26.39 kg/m   PROVIDERS: Gildardo Pounds, NP is PCP   Jenkins Rouge, MD is Cardiologist  LABS: Labs reviewed: Acceptable for surgery. (all labs ordered are listed, but only abnormal results are displayed)  Labs Reviewed  BASIC METABOLIC PANEL - Abnormal; Notable for the following components:      Result Value   Creatinine, Ser 1.06 (*)    GFR calc non Af Amer 55 (*)    All other components within normal limits  CBC - Abnormal; Notable for the following  components:   RBC 3.67 (*)    Hemoglobin 10.9 (*)    HCT 35.4 (*)    All other components within normal limits  SURGICAL PCR SCREEN     IMAGES: Chest Xray 09/25/2018 FINDINGS: The heart size and mediastinal contours are within normal limits. Both lungs are clear. The visualized skeletal structures are unremarkable.  IMPRESSION: No active cardiopulmonary disease.  EKG: 10/09/2018 Rate 85 bpm Normal sinus rhythm Nonspecific T wave abnormality Abnormal ECG   CV:  Past Medical History:  Diagnosis Date  . Arthritis   . Heart attack ([place]) 2004  . Hypertension   . Stroke [facility]) 2015    Past Surgical History:  Procedure Laterality Date  . ABDOMINAL HYSTERECTOMY    . TUBAL LIGATION      MEDICATIONS: . aspirin EC 81 MG tablet  . atorvastatin (LIPITOR) 40 MG tablet  . dipyridamole-aspirin (AGGRENOX) 200-25 MG 12hr capsule  . lisinopril (PRINIVIL,ZESTRIL) 20 MG tablet  . QUEtiapine (SEROQUEL) 50 MG tablet  . vitamin B-12 (CYANOCOBALAMIN) 1000 MCG tablet  . Vitamin D, Ergocalciferol, (DRISDOL) 1.25 MG (50000 UT) CAPS capsule   No current facility-administered medications for this encounter.      Maia Plan [facility] Pre-Surgical Testing [phone] 11/07/18 3:07 PM

## 2018-11-07 NOTE — Telephone Encounter (Signed)
Stop aspirin 10 days before surgery

## 2018-11-07 NOTE — Telephone Encounter (Signed)
Please advise 

## 2018-11-07 NOTE — Telephone Encounter (Signed)
Pt stated that she transferred care to a new provider.

## 2018-11-07 NOTE — Anesthesia Preprocedure Evaluation (Deleted)
Anesthesia Evaluation    Airway        Dental   Pulmonary Current Smoker,           Cardiovascular hypertension,      Neuro/Psych    GI/Hepatic   Endo/Other    Renal/GU      Musculoskeletal   Abdominal   Peds  Hematology   Anesthesia Other Findings   Reproductive/Obstetrics                             Anesthesia Physical Anesthesia Plan  ASA:   Anesthesia Plan:    Post-op Pain Management:    Induction:   PONV Risk Score and Plan:   Airway Management Planned:   Additional Equipment:   Intra-op Plan:   Post-operative Plan:   Informed Consent:   Plan Discussed with:   Anesthesia Plan Comments: (See PAT 11/05/18 note, Konrad Felix, PA-C)        Anesthesia Quick Evaluation

## 2018-11-07 NOTE — Telephone Encounter (Signed)
I called patient, patient had tooth pulled 11/07/2018

## 2018-11-07 NOTE — Telephone Encounter (Signed)
Pre-admission called- Just reviewing chart and asking if a test for cocaine was going to be done the day of surgery.  I  told her yes.  She is asking if there is an order for this.  Please complete orders if not complete.   Patient is unaware of when to stop aspirin.  Please call and advise patient.    Pt's cb  [phone]

## 2018-11-12 ENCOUNTER — Encounter (HOSPITAL_COMMUNITY): Payer: Self-pay | Admitting: General Practice

## 2018-11-12 ENCOUNTER — Ambulatory Visit (HOSPITAL_COMMUNITY)
Admission: RE | Admit: 2018-11-12 | Discharge: 2018-11-12 | Disposition: A | Discharge status: Home / Self Care | Payer: 59 | Attending: Orthopaedic Surgery | Admitting: Orthopaedic Surgery

## 2018-11-12 ENCOUNTER — Encounter (HOSPITAL_COMMUNITY)
Admission: RE | Disposition: A | Discharge status: Home / Self Care | Payer: Self-pay | Source: Home / Self Care | Attending: Orthopaedic Surgery

## 2018-11-12 ENCOUNTER — Ambulatory Visit: Payer: Medicaid Other | Admitting: Internal Medicine

## 2018-11-12 DIAGNOSIS — M1712 Unilateral primary osteoarthritis, left knee: Secondary | ICD-10-CM | POA: Insufficient documentation

## 2018-11-12 DIAGNOSIS — Z5309 Procedure and treatment not carried out because of other contraindication: Secondary | ICD-10-CM | POA: Diagnosis not present

## 2018-11-12 DIAGNOSIS — F1420 Cocaine dependence, uncomplicated: Secondary | ICD-10-CM | POA: Diagnosis not present

## 2018-11-12 LAB — RAPID URINE DRUG SCREEN, HOSP PERFORMED
AMPHETAMINES: NOT DETECTED
Barbiturates: NOT DETECTED
Benzodiazepines: NOT DETECTED
Cocaine: POSITIVE — AB
Opiates: NOT DETECTED
Tetrahydrocannabinol: POSITIVE — AB

## 2018-11-12 SURGERY — ARTHROPLASTY, KNEE, TOTAL
Anesthesia: Choice | Laterality: Left

## 2018-11-12 MED ORDER — LACTATED RINGERS IV SOLN: INTRAVENOUS | Status: DC

## 2018-11-12 NOTE — Progress Notes (Signed)
Anesthesia and Dr. Durward Fortes notified of QDS results.

## 2018-11-12 NOTE — Progress Notes (Signed)
Patient notified of Urine drug screen results, surgery being cancelled. Patient to reschedule with the office.

## 2018-11-13 ENCOUNTER — Other Ambulatory Visit: Payer: Self-pay | Admitting: Nurse Practitioner

## 2018-11-13 NOTE — Telephone Encounter (Signed)
1) Medication(s) Requested (by name): atorvastatin (LIPITOR) 40 MG tablet  lisinopril (PRINIVIL,ZESTRIL) 20 MG  QUEtiapine (SEROQUEL) 50 MG tablet   2) Pharmacy of Choice: [id number] - [place], [address] AT [place]  3) Special Requests: Pt would like enough supply to last until next appt date  Approved medications will be sent to the pharmacy, we will reach out if there is an issue.  Requests made after 3pm may not be addressed until the following business day!  If a patient is unsure of the name of the medication(s) please note and ask patient to call back when they are able to provide all info, do not send to responsible party until all information is available!

## 2018-11-17 MED ORDER — QUETIAPINE FUMARATE 50 MG PO TABS: 50.0000 mg | ORAL_TABLET | Freq: Every day | ORAL | 2 refills | Status: DC

## 2018-11-17 MED ORDER — LISINOPRIL 20 MG PO TABS: 20.0000 mg | ORAL_TABLET | Freq: Every day | ORAL | 2 refills | Status: DC

## 2018-11-17 MED ORDER — ATORVASTATIN CALCIUM 40 MG PO TABS: 40.0000 mg | ORAL_TABLET | Freq: Every day | ORAL | 2 refills | Status: DC

## 2018-11-26 ENCOUNTER — Inpatient Hospital Stay (INDEPENDENT_AMBULATORY_CARE_PROVIDER_SITE_OTHER): Payer: Medicaid Other | Admitting: Orthopaedic Surgery

## 2019-01-14 ENCOUNTER — Encounter: Payer: Self-pay | Admitting: Nurse Practitioner

## 2019-01-14 ENCOUNTER — Other Ambulatory Visit: Payer: Self-pay

## 2019-01-14 ENCOUNTER — Ambulatory Visit: Payer: 59 | Attending: Nurse Practitioner | Admitting: Nurse Practitioner

## 2019-01-14 DIAGNOSIS — F5101 Primary insomnia: Secondary | ICD-10-CM | POA: Diagnosis not present

## 2019-01-14 DIAGNOSIS — Z79899 Other long term (current) drug therapy: Secondary | ICD-10-CM | POA: Insufficient documentation

## 2019-01-14 DIAGNOSIS — I10 Essential (primary) hypertension: Secondary | ICD-10-CM | POA: Insufficient documentation

## 2019-01-14 DIAGNOSIS — M1712 Unilateral primary osteoarthritis, left knee: Secondary | ICD-10-CM | POA: Diagnosis not present

## 2019-01-14 DIAGNOSIS — F17210 Nicotine dependence, cigarettes, uncomplicated: Secondary | ICD-10-CM | POA: Insufficient documentation

## 2019-01-14 DIAGNOSIS — I252 Old myocardial infarction: Secondary | ICD-10-CM | POA: Insufficient documentation

## 2019-01-14 DIAGNOSIS — Z8673 Personal history of transient ischemic attack (TIA), and cerebral infarction without residual deficits: Secondary | ICD-10-CM | POA: Insufficient documentation

## 2019-01-14 MED ORDER — QUETIAPINE FUMARATE 50 MG PO TABS: 50.0000 mg | ORAL_TABLET | Freq: Every day | ORAL | 0 refills | Status: DC

## 2019-01-14 MED ORDER — DULOXETINE HCL 30 MG PO CPEP: 30.0000 mg | ORAL_CAPSULE | Freq: Every day | ORAL | 1 refills | Status: DC

## 2019-01-14 MED ORDER — LISINOPRIL 20 MG PO TABS: 20.0000 mg | ORAL_TABLET | Freq: Every day | ORAL | 0 refills | Status: DC

## 2019-01-14 NOTE — Progress Notes (Signed)
Virtual Visit via Telephone Note Due to national recommendations of social distancing due to [name], telehealth visit is felt to be most appropriate for this patient at this time.  I discussed the limitations, risks, security and privacy concerns of performing an evaluation and management service by telephone and the availability of in person appointments. I also discussed with the patient that there may be a patient responsible charge related to this service. The patient expressed understanding and agreed to proceed.    I connected with Judy Martinez on 01/14/19  at  10:20 AM EDT  EDT by telephone and verified that I am speaking with the correct person using two identifiers.   Consent I discussed the limitations, risks, security and privacy concerns of performing an evaluation and management service by telephone and the availability of in person appointments. I also discussed with the patient that there may be a patient responsible charge related to this service. The patient expressed understanding and agreed to proceed.   Location of Patient: Private  residence   Location of Provider: [place] and [place] participating in Telemedicine visit: [name] FNP-BC [place]    History of Present Illness: Telemedicine visit for: Follow up to HTN She has a history of MI, Tobacco dependence, Left Knee DJD, Stroke (2014) with no residual deficits and substance abuse (cocaine, THC).   Essential Hypertension Currently taking lisinopril 20 mg daily. Can't find blood pressure cuff so she is not monitoring her blood pressure at home. She denies chest pain, shortness of breath, palpitations, lightheadedness, dizziness, headaches or BLE edema.   BP Readings from Last 3 Encounters:  11/05/18 (!) 123/95  10/29/18 112/63  10/15/18 110/72   Left Knee OA She is asking for pain medication today for her left knee pain as well as a referral to  another orthopedist. She was scheduled with Dr. Durward Fortes for Left TKR however she states her surgery was cancelled due to positive UDS and she was instructed to find another Orthopedist. I have reviewed orthopedic records and patient was instructed that although surgery was cancelled she could reschedule at a later date. I informed patient that she was not dismissed from Dr. Rudene Anda office based on the notes reviewed and that she could call them to reschedule. She verbalized understanding.    Insomnia Chronic and well controlled with 50 mg of Seroquel.    Past Medical History:  Diagnosis Date  . Arthritis   . Heart attack ([place]) 2004  . Hypertension   . Stroke [facility]) 2015    Past Surgical History:  Procedure Laterality Date  . ABDOMINAL HYSTERECTOMY    . TUBAL LIGATION      Family History  Problem Relation Age of Onset  . Alcohol abuse Father     Social History   Socioeconomic History  . Marital status: Legally Separated    Spouse name: Not on file  . Number of children: Not on file  . Years of education: Not on file  . Highest education level: Not on file  Occupational History  . Not on file  Social Needs  . Financial resource strain: Not on file  . Food insecurity:    Worry: Not on file    Inability: Not on file  . Transportation needs:    Medical: Not on file    Non-medical: Not on file  Tobacco Use  . Smoking status: Current Every Day Smoker    Packs/day: 0.50  Years: 15.00    Pack years: 7.50    Types: Cigarettes  . Smokeless tobacco: Never Used  Substance and Sexual Activity  . Alcohol use: Yes    Comment: occ beer  . Drug use: Yes    Frequency: 2.0 times per week    Types: Cocaine, Marijuana    Comment: last used cocaine few days ago, marijuana last used 2-3 weeks ago  . Sexual activity: Yes    Birth control/protection: Surgical  Lifestyle  . Physical activity:    Days per week: Not on file    Minutes per session: Not on file  . Stress: Not on  file  Relationships  . Social connections:    Talks on phone: Not on file    Gets together: Not on file    Attends religious service: Not on file    Active member of club or organization: Not on file    Attends meetings of clubs or organizations: Not on file    Relationship status: Not on file  Other Topics Concern  . Not on file  Social History Narrative  . Not on file      Observations/Objective: Awake, alert and oriented x 3   Review of Systems  Constitutional: Negative for fever, malaise/fatigue and weight loss.  HENT: Negative.  Negative for nosebleeds.   Eyes: Negative.  Negative for blurred vision, double vision and photophobia.  Respiratory: Negative.  Negative for cough and shortness of breath.   Cardiovascular: Negative.  Negative for chest pain, palpitations and leg swelling.  Gastrointestinal: Negative.  Negative for heartburn, nausea and vomiting.  Musculoskeletal: Positive for back pain and joint pain. Negative for myalgias.  Neurological: Negative.  Negative for dizziness, focal weakness, seizures and headaches.  Psychiatric/Behavioral: Negative for suicidal ideas. The patient has insomnia (controlled).     Assessment and Plan: Diagnoses and all orders for this visit:  Essential hypertension -     lisinopril (ZESTRIL) 20 MG tablet; Take 1 tablet (20 mg total) by mouth daily. Continue all antihypertensives as prescribed.  Remember to bring in your blood pressure log with you for your follow up appointment.  DASH/Mediterranean Diets are healthier choices for HTN.    Primary insomnia -     QUEtiapine (SEROQUEL) 50 MG tablet; Take 1 tablet (50 mg total) by mouth at bedtime.    Primary osteoarthritis of left knee Cymbalta 30mg  daily. May alternate with heat and ice application for pain relief. May also alternate with acetaminophen and Ibuprofen as prescribed pain relief.  Patient has been instructed to follow up with Ortho.      Follow Up  Instructions Return in about 3 months (around 04/16/2019).     I discussed the assessment and treatment plan with the patient. The patient was provided an opportunity to ask questions and all were answered. The patient agreed with the plan and demonstrated an understanding of the instructions.   The patient was advised to call back or seek an in-person evaluation if the symptoms worsen or if the condition fails to improve as anticipated.  I provided 18 minutes of non-face-to-face time during this encounter including median intraservice time, reviewing previous notes, labs, imaging, medications and explaining diagnosis and management.  Gildardo Pounds, FNP-BC

## 2019-02-06 ENCOUNTER — Other Ambulatory Visit: Payer: Self-pay | Admitting: Pharmacist

## 2019-02-06 MED ORDER — ATORVASTATIN CALCIUM 40 MG PO TABS: 40.0000 mg | ORAL_TABLET | Freq: Every day | ORAL | 0 refills | Status: DC

## 2019-03-04 ENCOUNTER — Other Ambulatory Visit: Payer: Self-pay | Admitting: Nurse Practitioner

## 2019-03-04 NOTE — Telephone Encounter (Signed)
1) Medication(s) Requested (by name): dipyridamole-aspirin ([place]) 200-25 MG 12hr capsule [[id number]]    2) Pharmacy of Choice: [facility], [place] AT [address]   3) Special Requests:   Approved medications will be sent to the pharmacy, we will reach out if there is an issue.  Requests made after 3pm may not be addressed until the following business day!  If a patient is unsure of the name of the medication(s) please note and ask patient to call back when they are able to provide all info, do not send to responsible party until all information is available!

## 2019-03-09 ENCOUNTER — Other Ambulatory Visit: Payer: Self-pay | Admitting: Nurse Practitioner

## 2019-03-10 ENCOUNTER — Other Ambulatory Visit: Payer: Self-pay | Admitting: Nurse Practitioner

## 2019-03-10 MED ORDER — ASPIRIN-DIPYRIDAMOLE ER 25-200 MG PO CP12: 1.0000 | ORAL_CAPSULE | Freq: Every day | ORAL | 0 refills | Status: DC

## 2019-04-28 ENCOUNTER — Ambulatory Visit: Payer: 59 | Attending: Nurse Practitioner | Admitting: Nurse Practitioner

## 2019-04-28 ENCOUNTER — Other Ambulatory Visit: Payer: Self-pay | Admitting: Nurse Practitioner

## 2019-04-28 ENCOUNTER — Encounter: Payer: Self-pay | Admitting: Nurse Practitioner

## 2019-04-28 ENCOUNTER — Other Ambulatory Visit: Payer: Self-pay

## 2019-04-28 DIAGNOSIS — I10 Essential (primary) hypertension: Secondary | ICD-10-CM

## 2019-04-28 DIAGNOSIS — Z780 Asymptomatic menopausal state: Secondary | ICD-10-CM

## 2019-04-28 DIAGNOSIS — M25562 Pain in left knee: Secondary | ICD-10-CM | POA: Diagnosis not present

## 2019-04-28 DIAGNOSIS — F5101 Primary insomnia: Secondary | ICD-10-CM | POA: Diagnosis not present

## 2019-04-28 DIAGNOSIS — F17200 Nicotine dependence, unspecified, uncomplicated: Secondary | ICD-10-CM

## 2019-04-28 DIAGNOSIS — G8929 Other chronic pain: Secondary | ICD-10-CM | POA: Diagnosis not present

## 2019-04-28 DIAGNOSIS — E782 Mixed hyperlipidemia: Secondary | ICD-10-CM

## 2019-04-28 DIAGNOSIS — Z13820 Encounter for screening for osteoporosis: Secondary | ICD-10-CM

## 2019-04-28 DIAGNOSIS — Z1231 Encounter for screening mammogram for malignant neoplasm of breast: Secondary | ICD-10-CM

## 2019-04-28 DIAGNOSIS — F17210 Nicotine dependence, cigarettes, uncomplicated: Secondary | ICD-10-CM

## 2019-04-28 MED ORDER — DULOXETINE HCL 30 MG PO CPEP: ORAL_CAPSULE | ORAL | 2 refills | Status: DC

## 2019-04-28 MED ORDER — ATORVASTATIN CALCIUM 40 MG PO TABS: 40.0000 mg | ORAL_TABLET | Freq: Every day | ORAL | 0 refills | Status: DC

## 2019-04-28 MED ORDER — QUETIAPINE FUMARATE 50 MG PO TABS: 50.0000 mg | ORAL_TABLET | Freq: Every day | ORAL | 0 refills | Status: DC

## 2019-04-28 MED ORDER — PSEUDOEPH-BROMPHEN-DM 30-2-10 MG/5ML PO SYRP: 5.0000 mL | ORAL_SOLUTION | Freq: Four times a day (QID) | ORAL | 0 refills | Status: AC | PRN

## 2019-04-28 MED ORDER — LISINOPRIL 20 MG PO TABS: 20.0000 mg | ORAL_TABLET | Freq: Every day | ORAL | 0 refills | Status: DC

## 2019-04-28 MED ORDER — ALBUTEROL SULFATE HFA 108 (90 BASE) MCG/ACT IN AERS: 2.0000 | INHALATION_SPRAY | Freq: Four times a day (QID) | RESPIRATORY_TRACT | 0 refills | Status: DC | PRN

## 2019-04-28 NOTE — Progress Notes (Signed)
Virtual Visit via Telephone Note Due to national recommendations of social distancing due to [place] 19, telehealth visit is felt to be most appropriate for this patient at this time.  I discussed the limitations, risks, security and privacy concerns of performing an evaluation and management service by telephone and the availability of in person appointments. I also discussed with the patient that there may be a patient responsible charge related to this service. The patient expressed understanding and agreed to proceed.    I connected with Judy Martinez on 04/28/19  at   8:30 AM EDT  EDT by telephone and verified that I am speaking with the correct person using two identifiers.   Consent I discussed the limitations, risks, security and privacy concerns of performing an evaluation and management service by telephone and the availability of in person appointments. I also discussed with the patient that there may be a patient responsible charge related to this service. The patient expressed understanding and agreed to proceed.   Location of Patient: Private Residence   Location of Provider: [place] and [place] participating in Telemedicine visit: [name] FNP-BC [place]    History of Present Illness: Telemedicine visit for: Follow up She has a dry cough which seems to be worse at night. Onset of cough was one week ago. She has been using cough drops which seem to help.   Essential Hypertension She was initially scheduled for an office visit however noted for cough and fever symptoms today and visit was switched to tele. She has not been able to check her blood pressure. States she lost her monitor when she moved down here. Taking lisinopril 20 mg daily. Denies chest pain, shortness of breath, palpitations, lightheadedness, dizziness, headaches or BLE edema.  BP Readings from Last 3 Encounters:  11/05/18 (!) 123/95  10/29/18 112/63   10/15/18 110/72     Chronic left knee pain She was discharged from Dr. Rudene Anda office and knee surgery was canceled due to cocaine and THC found via drug screening. Will refer for second opinion. She has had pain for 2 years which has gradually worsened. Pain 8-10/10. Failed conservative treatment for advanced DJD of left knee. Currently taking cymbalta 30mg  daily with minimal relief of pain.  Past Medical History:  Diagnosis Date  . Arthritis   . Heart attack ([place]) 2004  . Hypertension   . Stroke [facility]) 2015    Past Surgical History:  Procedure Laterality Date  . ABDOMINAL HYSTERECTOMY    . TUBAL LIGATION      Family History  Problem Relation Age of Onset  . Alcohol abuse Father     Social History   Socioeconomic History  . Marital status: Legally Separated    Spouse name: Not on file  . Number of children: Not on file  . Years of education: Not on file  . Highest education level: Not on file  Occupational History  . Not on file  Social Needs  . Financial resource strain: Not on file  . Food insecurity    Worry: Not on file    Inability: Not on file  . Transportation needs    Medical: Not on file    Non-medical: Not on file  Tobacco Use  . Smoking status: Current Every Day Smoker    Packs/day: 0.50    Years: 15.00    Pack years: 7.50    Types: Cigarettes  . Smokeless tobacco: Never Used  Substance and  Sexual Activity  . Alcohol use: Yes    Comment: occ beer  . Drug use: Not Currently    Frequency: 2.0 times per week    Types: Cocaine, Marijuana    Comment: last used cocaine few days ago, marijuana last used 2-3 weeks ago  . Sexual activity: Yes    Birth control/protection: Surgical  Lifestyle  . Physical activity    Days per week: Not on file    Minutes per session: Not on file  . Stress: Not on file  Relationships  . Social Herbalist on phone: Not on file    Gets together: Not on file    Attends religious service: Not on file     Active member of club or organization: Not on file    Attends meetings of clubs or organizations: Not on file    Relationship status: Not on file  Other Topics Concern  . Not on file  Social History Narrative  . Not on file     Observations/Objective: Awake, alert and oriented x 3   Review of Systems  Constitutional: Positive for fever. Negative for malaise/fatigue and weight loss.  HENT: Negative.  Negative for nosebleeds.   Eyes: Negative.  Negative for blurred vision, double vision and photophobia.  Respiratory: Positive for cough. Negative for shortness of breath.   Cardiovascular: Negative.  Negative for chest pain, palpitations and leg swelling.  Gastrointestinal: Negative.  Negative for heartburn, nausea and vomiting.  Musculoskeletal: Positive for back pain and joint pain. Negative for myalgias.  Neurological: Negative.  Negative for dizziness, focal weakness, seizures and headaches.  Psychiatric/Behavioral: Negative for suicidal ideas. The patient has insomnia (well controlled with seroquel).     Assessment and Plan: Judy Martinez was seen today for follow-up.  Diagnoses and all orders for this visit:  Essential hypertension -     lisinopril (ZESTRIL) 20 MG tablet; Take 1 tablet (20 mg total) by mouth daily. Continue all antihypertensives as prescribed.  Remember to bring in your blood pressure log with you for your follow up appointment.  DASH/Mediterranean Diets are healthier choices for HTN.   Chronic pain of left knee -     DULoxetine (CYMBALTA) 30 MG capsule; TAKE 1 CAPSULE(30 MG) BY MOUTH DAILY -     Ambulatory referral to Orthopedic Surgery Work on losing weight to help reduce joint pain. May alternate with heat and ice application for pain relief. May also alternate with acetaminophen and Ibuprofen as prescribed pain relief. Other alternatives include massage, acupuncture and water aerobics.  You must stay active and avoid a sedentary lifestyle.  Primary insomnia -      QUEtiapine (SEROQUEL) 50 MG tablet; Take 1 tablet (50 mg total) by mouth at bedtime.  Breast cancer screening by mammogram -     MM 3D SCREEN BREAST BILATERAL; Future  Encounter for osteoporosis screening in asymptomatic postmenopausal patient -     DG Bone Density; Future  Mixed hyperlipidemia -     atorvastatin (LIPITOR) 40 MG tablet; Take 1 tablet (40 mg total) by mouth daily. INSTRUCTIONS: Work on a low fat, heart healthy diet and participate in regular aerobic exercise program by working out at least 150 minutes per week; 5 days a week-30 minutes per day. Avoid red meat, fried foods. junk foods, sodas, sugary drinks, unhealthy snacking, alcohol and smoking.  Drink at least 48oz of water per day and monitor your carbohydrate intake daily.  Lab Results  Component Value Date   LDLCALC 148 (H)  07/30/2018    Tobacco dependence Judy Martinez was counseled on the dangers of tobacco use, and was advised to quit. Reviewed strategies to maximize success, including removing cigarettes and smoking materials from environment, stress management and support of family/friends as well as pharmacological alternatives including: Wellbutrin, Chantix, Nicotine patch, Nicotine gum or lozenges. Smoking cessation support: smoking cessation hotline: 1-800-QUIT-NOW.  Smoking cessation classes are also available through [facility]. Call [phone] or visit our website at [email or web address]   A total of 2 minutes was spent on counseling for smoking cessation and Judy Martinez is not ready to quit.    Follow Up Instructions Return for PAP SMEAR, BP recheck, Fasting labs.     I discussed the assessment and treatment plan with the patient. The patient was provided an opportunity to ask questions and all were answered. The patient agreed with the plan and demonstrated an understanding of the instructions.   The patient was advised to call back or seek an in-person evaluation if the symptoms  worsen or if the condition fails to improve as anticipated.  I provided 20 minutes of non-face-to-face time during this encounter including median intraservice time, reviewing previous notes, labs, imaging, medications and explaining diagnosis and management.  Gildardo Pounds, FNP-BC

## 2019-05-01 ENCOUNTER — Encounter: Payer: Self-pay | Admitting: Orthopaedic Surgery

## 2019-05-01 ENCOUNTER — Ambulatory Visit (INDEPENDENT_AMBULATORY_CARE_PROVIDER_SITE_OTHER): Payer: 59 | Admitting: Orthopaedic Surgery

## 2019-05-01 ENCOUNTER — Other Ambulatory Visit: Payer: Self-pay

## 2019-05-01 ENCOUNTER — Ambulatory Visit: Payer: Self-pay

## 2019-05-01 DIAGNOSIS — M1712 Unilateral primary osteoarthritis, left knee: Secondary | ICD-10-CM

## 2019-05-01 NOTE — Progress Notes (Signed)
Office Visit Note   Patient: Judy Martinez           Date of Birth: [birth year]           MRN: [id number] Visit Date: 05/01/2019              Requested by: Gildardo Pounds, NP [place],  [place] [id number] PCP: Gildardo Pounds, NP   Assessment & Plan: Visit Diagnoses:  1. Primary osteoarthritis of left knee     Plan: Impression is left knee end-stage degenerative joint disease.  The patient has exhausted all conservative treatment options and would like to proceed with left total knee replacement.  She does feel that she can abstain from cocaine use to proceed with surgical intervention.  Risk, benefits and possible occasions reviewed.  Rehab recovery time discussed.  All questions were answered.  Follow-Up Instructions: Return for 2 weeks post-op.   Orders:  Orders Placed This Encounter  Procedures   XR KNEE 3 VIEW LEFT   No orders of the defined types were placed in this encounter.     Procedures: No procedures performed   Clinical Data: No additional findings.   Subjective: Chief Complaint  Patient presents with   Left Knee - Pain    HPI patient is a 68 year old female who presents our clinic today with left knee pain.  History of end-stage generative joint disease to the left knee.  She has been treated in the past by an orthopedist in [place].  She recently moved to [facility] and has been been seen by Dr. Durward Fortes.  The pain she is having is to the entire left knee.  She describes this as a constant ache.  Pain is exacerbated with changes in the weather as well as first thing in the morning.  She is tried over-the-counter medications without relief of symptoms.  She has tried cortisone and viscosupplementation injections without relief of symptoms.  She notes that she was supposed to have her left knee replaced with Dr. Durward Fortes, but was unable to abstain from cocaine use.  She also notes that she has a history of a stroke and MI and takes 81 mg aspirin  on a daily basis.  Review of Systems as detailed in HPI.  All others reviewed and are negative.   Objective: Vital Signs: There were no vitals taken for this visit.  Physical Exam well-developed and well-nourished female in no acute distress.  Alert and oriented x3. Ortho Exam examination of the left knee shows a trace effusion.  Range of motion 5 to 90 degrees.  Medial joint line tenderness.  She is neurovascularly intact distally.  Specialty Comments:  No specialty comments available.  Imaging: Xr Knee 3 View Left  Result Date: 05/01/2019 X-rays demonstrate marked tricompartmental degenerative changes    PMFS History: Patient Active Problem List   Diagnosis Date Noted   Elevated liver enzymes 09/25/2018   Cocaine use 09/25/2018   Smoker unmotivated to quit 09/25/2018   Chronic bilateral low back pain with sciatica 08/28/2018   Chronic pain of left knee 08/28/2018   Alcohol abuse    Chronic insomnia    Hyperlipidemia    Essential hypertension    History of CVA (cerebrovascular accident)    Past Medical History:  Diagnosis Date   Arthritis    Heart attack ([place]) 2004   Hypertension    Stroke [facility]) 2015    Family History  Problem Relation Age of Onset   Alcohol abuse Father  Past Surgical History:  Procedure Laterality Date   ABDOMINAL HYSTERECTOMY     TUBAL LIGATION     Social History   Occupational History   Not on file  Tobacco Use   Smoking status: Current Every Day Smoker    Packs/day: 0.50    Years: 15.00    Pack years: 7.50    Types: Cigarettes   Smokeless tobacco: Never Used  Substance and Sexual Activity   Alcohol use: Yes    Comment: occ beer   Drug use: Not Currently    Frequency: 2.0 times per week    Types: Cocaine, Marijuana    Comment: last used cocaine few days ago, marijuana last used 2-3 weeks ago   Sexual activity: Yes    Birth control/protection: Surgical

## 2019-05-22 ENCOUNTER — Other Ambulatory Visit: Payer: Self-pay

## 2019-05-26 ENCOUNTER — Other Ambulatory Visit: Payer: Self-pay | Admitting: Nurse Practitioner

## 2019-05-28 ENCOUNTER — Encounter (HOSPITAL_COMMUNITY)
Admission: RE | Admit: 2019-05-28 | Discharge: 2019-05-28 | Disposition: A | Discharge status: Home / Self Care | Payer: 59 | Source: Ambulatory Visit | Attending: Orthopaedic Surgery | Admitting: Orthopaedic Surgery

## 2019-05-28 ENCOUNTER — Encounter (HOSPITAL_COMMUNITY): Payer: Self-pay

## 2019-05-28 ENCOUNTER — Other Ambulatory Visit: Payer: Self-pay

## 2019-05-28 ENCOUNTER — Inpatient Hospital Stay (HOSPITAL_COMMUNITY): Admission: RE | Admit: 2019-05-28 | Payer: 59 | Source: Ambulatory Visit

## 2019-05-28 DIAGNOSIS — Z01812 Encounter for preprocedural laboratory examination: Secondary | ICD-10-CM | POA: Diagnosis not present

## 2019-05-28 LAB — COMPREHENSIVE METABOLIC PANEL
ALT: 42 U/L (ref 0–44)
AST: 43 U/L — ABNORMAL HIGH (ref 15–41)
Albumin: 3.8 g/dL (ref 3.5–5.0)
Alkaline Phosphatase: 71 U/L (ref 38–126)
Anion gap: 12 (ref 5–15)
BUN: 19 mg/dL (ref 8–23)
CO2: 20 mmol/L — ABNORMAL LOW (ref 22–32)
Calcium: 9.4 mg/dL (ref 8.9–10.3)
Chloride: 109 mmol/L (ref 98–111)
Creatinine, Ser: 0.89 mg/dL (ref 0.44–1.00)
GFR calc Af Amer: >60 mL/min (ref >60)
GFR calc non Af Amer: >60 mL/min (ref >60)
Glucose, Bld: 99 mg/dL (ref 70–99)
Potassium: 3.5 mmol/L (ref 3.5–5.1)
Sodium: 141 mmol/L (ref 135–145)
Total Bilirubin: 0.4 mg/dL (ref 0.3–1.2)
Total Protein: 7 g/dL (ref 6.5–8.1)

## 2019-05-28 LAB — CBC WITH DIFFERENTIAL/PLATELET
Abs Immature Granulocytes: 0.02 10*3/uL (ref 0.00–0.07)
Basophils Absolute: 0 10*3/uL (ref 0.0–0.1)
Basophils Relative: 0 %
Eosinophils Absolute: 0.1 10*3/uL (ref 0.0–0.5)
Eosinophils Relative: 1 %
HCT: 33.7 % — ABNORMAL LOW (ref 36.0–46.0)
Hemoglobin: 10.9 g/dL — ABNORMAL LOW (ref 12.0–15.0)
Immature Granulocytes: 0 %
Lymphocytes Relative: 23 %
Lymphs Abs: 1.5 10*3/uL (ref 0.7–4.0)
MCH: 32.3 pg (ref 26.0–34.0)
MCHC: 32.3 g/dL (ref 30.0–36.0)
MCV: 100 fL (ref 80.0–100.0)
Monocytes Absolute: 0.4 10*3/uL (ref 0.1–1.0)
Monocytes Relative: 6 %
Neutro Abs: 4.6 10*3/uL (ref 1.7–7.7)
Neutrophils Relative %: 70 %
Platelets: 300 10*3/uL (ref 150–400)
RBC: 3.37 MIL/uL — ABNORMAL LOW (ref 3.87–5.11)
RDW: 14.1 % (ref 11.5–15.5)
WBC: 6.6 10*3/uL (ref 4.0–10.5)
nRBC: 0 % (ref 0.0–0.2)

## 2019-05-28 LAB — PROTIME-INR
INR: 1.2 (ref 0.8–1.2)
Prothrombin Time: 15.3 seconds — ABNORMAL HIGH (ref 11.4–15.2)

## 2019-05-28 LAB — ABO/RH: ABO/RH(D): A POS

## 2019-05-28 LAB — TYPE AND SCREEN
ABO/RH(D): A POS
Antibody Screen: NEGATIVE

## 2019-05-28 LAB — SURGICAL PCR SCREEN
MRSA, PCR: NEGATIVE
Staphylococcus aureus: NEGATIVE

## 2019-05-28 LAB — APTT: aPTT: 25 seconds (ref 24–36)

## 2019-05-28 NOTE — Progress Notes (Addendum)
  Coronavirus Screening  Have you experienced the following symptoms:  Cough yes/no: No Fever (>100.37F)  yes/no: No Runny nose yes/no: No Sore throat yes/no: No Difficulty breathing/shortness of breath  yes/no: No Have you or a family member traveled in the last 14 days and where? yes/no: No  PCP - Fleming,Zelda NP  Cardiologist - denies  Chest x-ray - 09-25-18  EKG - 09-25-18  Stress Test - denies  ECHO - denies  Cardiac Cath - denies  AICD-denies PM-denies LOOP-denies  Sleep Study - denies CPAP - No  LABS-PCR,T/S,PT-INR,CMP,CBC diff,APTT  ASA-LD-09/14    HA1C-denies Fasting Blood Sugar -  Checks Blood Sugar _____ times a day  Anesthesia-Y H/o HTN, Stroke,MI  Pt denies having chest pain, sob, or fever at this time. All instructions explained to the pt, with a verbal understanding of the material. Pt agrees to go over the instructions while at home for a better understanding. Pt also instructed to self quarantine after being tested for COVID-19. The opportunity to ask questions was provided.

## 2019-05-28 NOTE — Pre-Procedure Instructions (Signed)
[facility]ORE [address], [place] [address] Phone: [phone] Fax: [phone]      Your procedure is scheduled on  06-02-19 Monday from [id number]  Report to [facility] Main Entrance "A" at 9A.M., and check in at the Admitting office.  Call this number if you have problems the morning of surgery:  [phone]  Call [phone] if you have any questions prior to your surgery date Monday-Friday 8am-4pm    Remember:  Do not eat after midnight the night before your surgery  You may drink clear liquids until 8AM the morning of your surgery.   Clear liquids allowed are: Water, Non-Citrus Juices (without pulp), Carbonated Beverages, Clear Tea, Black Coffee Only, and Gatorade  Please complete your PRE-SURGERY ENSURE that was provided to you by  8 AM  the morning of surgery.  Please, if able, drink it in one setting. DO NOT SIP.   Take these medicines the morning of surgery with A SIP OF WATER : atorvastatin (LIPITOR) acetaminophen (TYLENOL) albuterol (VENTOLIN HFA)as needed DULoxetine (CYMBALTA)  Follow your surgeon's instructions on when to stop Aspirin.  If no instructions were given by your surgeon then you will need to call the office to get those instructions.    7 days prior to surgery STOP taking any Aggrenox, Aspirin (unless otherwise instructed by your surgeon), Aleve, Naproxen, Ibuprofen, Motrin, Advil, Goody's, BC's, all herbal medications, fish oil, and all vitamins.    The Morning of Surgery  Do not wear jewelry, make-up or nail polish.  Do not wear lotions, powders, or perfumes, or deodorant  Do not shave 48 hours prior to surgery.   Do not bring valuables to the hospital.  [facility] is not responsible for any belongings or valuables.  If you are a smoker, DO NOT Smoke 24 hours prior to surgery IF you wear a CPAP at night please bring your mask, tubing,  and machine the morning of surgery   Remember that you must have someone to transport you home after your surgery, and remain with you for 24 hours if you are discharged the same day.   Contacts, glasses, hearing aids, dentures or bridgework may not be worn into surgery.    Leave your suitcase in the car.  After surgery it may be brought to your room.  For patients admitted to the hospital, discharge time will be determined by your treatment team.  Patients discharged the day of surgery will not be allowed to drive home.    Special instructions:   [facility]- Preparing For Surgery  Before surgery, you can play an important role. Because skin is not sterile, your skin needs to be as free of germs as possible. You can reduce the number of germs on your skin by washing with CHG (chlorahexidine gluconate) Soap before surgery.  CHG is an antiseptic cleaner which kills germs and bonds with the skin to continue killing germs even after washing.    Oral Hygiene is also important to reduce your risk of infection.  Remember - BRUSH YOUR TEETH THE MORNING OF SURGERY WITH YOUR REGULAR TOOTHPASTE  Please do not use if you have an allergy to CHG or antibacterial soaps. If your skin becomes reddened/irritated stop using the CHG.  Do not shave (including legs and underarms) for at least 48 hours prior to first CHG shower. It is OK to shave your face.  Please  follow these instructions carefully.   1. Shower the NIGHT BEFORE SURGERY and the MORNING OF SURGERY with CHG Soap.   2. If you chose to wash your hair, wash your hair first as usual with your normal shampoo.  3. After you shampoo, rinse your hair and body thoroughly to remove the shampoo.  4. Use CHG as you would any other liquid soap. You can apply CHG directly to the skin and wash gently with a scrungie or a clean washcloth.   5. Apply the CHG Soap to your body ONLY FROM THE NECK DOWN.  Do not use on open wounds or open sores. Avoid  contact with your eyes, ears, mouth and genitals (private parts). Wash Face and genitals (private parts)  with your normal soap.   6. Wash thoroughly, paying special attention to the area where your surgery will be performed.  7. Thoroughly rinse your body with warm water from the neck down.  8. DO NOT shower/wash with your normal soap after using and rinsing off the CHG Soap.  9. Pat yourself dry with a CLEAN TOWEL.  10. Wear CLEAN PAJAMAS to bed the night before surgery, wear comfortable clothes the morning of surgery  11. Place CLEAN SHEETS on your bed the night of your first shower and DO NOT SLEEP WITH PETS.    Day of Surgery:  Do not apply any deodorants/lotions. Please shower the morning of surgery with the CHG soap  Please wear clean clothes to the hospital/surgery center.   Remember to brush your teeth WITH YOUR REGULAR TOOTHPASTE.   Please read over the following fact sheets that you were given.

## 2019-05-29 ENCOUNTER — Other Ambulatory Visit (HOSPITAL_COMMUNITY)
Admission: RE | Admit: 2019-05-29 | Discharge: 2019-05-29 | Disposition: A | Discharge status: Home / Self Care | Payer: 59 | Source: Ambulatory Visit | Attending: Orthopaedic Surgery | Admitting: Orthopaedic Surgery

## 2019-05-29 ENCOUNTER — Telehealth: Payer: Self-pay | Admitting: Nurse Practitioner

## 2019-05-29 ENCOUNTER — Other Ambulatory Visit: Payer: Self-pay | Admitting: Family Medicine

## 2019-05-29 DIAGNOSIS — Z20828 Contact with and (suspected) exposure to other viral communicable diseases: Secondary | ICD-10-CM | POA: Insufficient documentation

## 2019-05-29 DIAGNOSIS — Z01812 Encounter for preprocedural laboratory examination: Secondary | ICD-10-CM | POA: Insufficient documentation

## 2019-05-29 MED ORDER — ASPIRIN-DIPYRIDAMOLE ER 25-200 MG PO CP12: 1.0000 | ORAL_CAPSULE | Freq: Every day | ORAL | 0 refills | Status: DC

## 2019-05-29 NOTE — Telephone Encounter (Signed)
1) Medication(s) Requested (by name): dipyridamole-aspirin (AGGRENOX) 200-25 MG   2) Pharmacy of Choice: [place], [facility]

## 2019-05-29 NOTE — Anesthesia Preprocedure Evaluation (Addendum)
Anesthesia Evaluation  Patient identified by MRN, date of birth, ID band Patient awake    Reviewed: Allergy & Precautions, NPO status , Patient's Chart, lab work & pertinent test results  Airway Mallampati: II  TM Distance: >3 FB Neck ROM: Full    Dental  (+) Missing, Dental Advisory Given,    Pulmonary neg pulmonary ROS, Current Smoker,    Pulmonary exam normal breath sounds clear to auscultation       Cardiovascular hypertension, + Past MI (2004)  Normal cardiovascular exam Rhythm:Regular Rate:Normal  HLD   Neuro/Psych PSYCHIATRIC DISORDERS Depression CVA (2015), No Residual Symptoms    GI/Hepatic negative GI ROS, (+)     substance abuse  cocaine use,   Endo/Other  negative endocrine ROS  Renal/GU negative Renal ROS  negative genitourinary   Musculoskeletal  (+) Arthritis ,   Abdominal   Peds  Hematology  (+) Blood dyscrasia (on dipyridamole, last dose 1 week ago), anemia ,   Anesthesia Other Findings   Reproductive/Obstetrics                         Anesthesia Physical Anesthesia Plan  ASA: III  Anesthesia Plan: Spinal and Regional   Post-op Pain Management:  Regional for Post-op pain   Induction:   PONV Risk Score and Plan: Treatment may vary due to age or medical condition and Propofol infusion  Airway Management Planned: Natural Airway  Additional Equipment:   Intra-op Plan:   Post-operative Plan:   Informed Consent: I have reviewed the patients History and Physical, chart, labs and discussed the procedure including the risks, benefits and alternatives for the proposed anesthesia with the patient or authorized representative who has indicated his/her understanding and acceptance.     Dental advisory given  Plan Discussed with: CRNA  Anesthesia Plan Comments:       Anesthesia Quick Evaluation

## 2019-05-30 LAB — NOVEL CORONAVIRUS, NAA (HOSP ORDER, SEND-OUT TO REF LAB; TAT 18-24 HRS): SARS-CoV-2, NAA: NOT DETECTED

## 2019-05-30 MED ORDER — VANCOMYCIN HCL IN DEXTROSE 1-5 GM/200ML-% IV SOLN: 1000.0000 mg | INTRAVENOUS | Status: DC

## 2019-05-30 MED ORDER — BUPIVACAINE LIPOSOME 1.3 % IJ SUSP
20.0000 mL | INTRAMUSCULAR | Status: DC
  Filled 2019-05-30: qty 20

## 2019-05-30 MED ORDER — TRANEXAMIC ACID-NACL 1000-0.7 MG/100ML-% IV SOLN
1000.0000 mg | INTRAVENOUS | Status: AC
  Administered 2019-06-02: 1000 mg via INTRAVENOUS
  Filled 2019-05-30: qty 100

## 2019-05-30 MED ORDER — TRANEXAMIC ACID 1000 MG/10ML IV SOLN
2000.0000 mg | INTRAVENOUS | Status: AC
  Administered 2019-06-02: 2000 mg via TOPICAL
  Filled 2019-05-30: qty 20

## 2019-06-02 ENCOUNTER — Other Ambulatory Visit: Payer: Self-pay

## 2019-06-02 ENCOUNTER — Encounter (HOSPITAL_COMMUNITY): Payer: Self-pay | Admitting: General Practice

## 2019-06-02 ENCOUNTER — Ambulatory Visit (HOSPITAL_COMMUNITY): Payer: 59 | Admitting: Physician Assistant

## 2019-06-02 ENCOUNTER — Observation Stay (HOSPITAL_COMMUNITY): Payer: 59

## 2019-06-02 ENCOUNTER — Encounter (HOSPITAL_COMMUNITY)
Admission: RE | Disposition: A | Discharge status: Home / Self Care | Payer: Self-pay | Source: Home / Self Care | Attending: Orthopaedic Surgery

## 2019-06-02 ENCOUNTER — Ambulatory Visit (HOSPITAL_COMMUNITY): Payer: 59 | Admitting: Anesthesiology

## 2019-06-02 ENCOUNTER — Observation Stay (HOSPITAL_COMMUNITY)
Admission: RE | Admit: 2019-06-02 | Discharge: 2019-06-03 | Disposition: A | Discharge status: Home / Self Care | Payer: 59 | Attending: Orthopaedic Surgery | Admitting: Orthopaedic Surgery

## 2019-06-02 DIAGNOSIS — Z8673 Personal history of transient ischemic attack (TIA), and cerebral infarction without residual deficits: Secondary | ICD-10-CM | POA: Insufficient documentation

## 2019-06-02 DIAGNOSIS — F17210 Nicotine dependence, cigarettes, uncomplicated: Secondary | ICD-10-CM | POA: Insufficient documentation

## 2019-06-02 DIAGNOSIS — M1712 Unilateral primary osteoarthritis, left knee: Secondary | ICD-10-CM | POA: Diagnosis not present

## 2019-06-02 DIAGNOSIS — I10 Essential (primary) hypertension: Secondary | ICD-10-CM | POA: Diagnosis not present

## 2019-06-02 DIAGNOSIS — I252 Old myocardial infarction: Secondary | ICD-10-CM | POA: Diagnosis not present

## 2019-06-02 DIAGNOSIS — Z23 Encounter for immunization: Secondary | ICD-10-CM | POA: Diagnosis not present

## 2019-06-02 DIAGNOSIS — Z96652 Presence of left artificial knee joint: Secondary | ICD-10-CM

## 2019-06-02 DIAGNOSIS — Z880 Allergy status to penicillin: Secondary | ICD-10-CM | POA: Insufficient documentation

## 2019-06-02 DIAGNOSIS — F329 Major depressive disorder, single episode, unspecified: Secondary | ICD-10-CM | POA: Insufficient documentation

## 2019-06-02 DIAGNOSIS — Z79899 Other long term (current) drug therapy: Secondary | ICD-10-CM | POA: Diagnosis not present

## 2019-06-02 DIAGNOSIS — Z7982 Long term (current) use of aspirin: Secondary | ICD-10-CM | POA: Diagnosis not present

## 2019-06-02 DIAGNOSIS — E785 Hyperlipidemia, unspecified: Secondary | ICD-10-CM | POA: Diagnosis not present

## 2019-06-02 DIAGNOSIS — D62 Acute posthemorrhagic anemia: Secondary | ICD-10-CM | POA: Diagnosis not present

## 2019-06-02 HISTORY — PX: TOTAL KNEE ARTHROPLASTY: SHX125

## 2019-06-02 LAB — RAPID URINE DRUG SCREEN, HOSP PERFORMED
Amphetamines: NOT DETECTED
Barbiturates: NOT DETECTED
Benzodiazepines: NOT DETECTED
Cocaine: NOT DETECTED
Opiates: NOT DETECTED
Tetrahydrocannabinol: NOT DETECTED

## 2019-06-02 SURGERY — ARTHROPLASTY, KNEE, TOTAL
Anesthesia: Regional | Site: Knee | Laterality: Left

## 2019-06-02 MED ORDER — ALUM & MAG HYDROXIDE-SIMETH 200-200-20 MG/5ML PO SUSP: 30.0000 mL | ORAL | Status: DC | PRN

## 2019-06-02 MED ORDER — PHENYLEPHRINE 40 MCG/ML (10ML) SYRINGE FOR IV PUSH (FOR BLOOD PRESSURE SUPPORT)
PREFILLED_SYRINGE | INTRAVENOUS | Status: AC
  Filled 2019-06-02: qty 10

## 2019-06-02 MED ORDER — METHOCARBAMOL 1000 MG/10ML IJ SOLN
500.0000 mg | Freq: Four times a day (QID) | INTRAVENOUS | Status: DC | PRN
  Filled 2019-06-02: qty 5

## 2019-06-02 MED ORDER — LISINOPRIL 20 MG PO TABS
20.0000 mg | ORAL_TABLET | Freq: Every day | ORAL | Status: DC
  Administered 2019-06-02 – 2019-06-03 (×2): 20 mg via ORAL
  Filled 2019-06-02 (×2): qty 1

## 2019-06-02 MED ORDER — METOCLOPRAMIDE HCL 5 MG PO TABS: 5.0000 mg | ORAL_TABLET | Freq: Three times a day (TID) | ORAL | Status: DC | PRN

## 2019-06-02 MED ORDER — ASPIRIN EC 81 MG PO TBEC: 81.0000 mg | DELAYED_RELEASE_TABLET | Freq: Two times a day (BID) | ORAL | 0 refills | Status: DC

## 2019-06-02 MED ORDER — OXYCODONE HCL ER 10 MG PO T12A
10.0000 mg | EXTENDED_RELEASE_TABLET | Freq: Two times a day (BID) | ORAL | Status: DC
  Administered 2019-06-02 – 2019-06-03 (×3): 10 mg via ORAL
  Filled 2019-06-02 (×3): qty 1

## 2019-06-02 MED ORDER — CHLORHEXIDINE GLUCONATE 4 % EX LIQD: 60.0000 mL | Freq: Once | CUTANEOUS | Status: DC

## 2019-06-02 MED ORDER — DEXAMETHASONE SODIUM PHOSPHATE 10 MG/ML IJ SOLN
INTRAMUSCULAR | Status: AC
  Filled 2019-06-02: qty 1

## 2019-06-02 MED ORDER — PROMETHAZINE HCL 25 MG PO TABS: 25.0000 mg | ORAL_TABLET | Freq: Four times a day (QID) | ORAL | 1 refills | Status: DC | PRN

## 2019-06-02 MED ORDER — 0.9 % SODIUM CHLORIDE (POUR BTL) OPTIME
TOPICAL | Status: DC | PRN
  Administered 2019-06-02: 12:00:00 1000 mL

## 2019-06-02 MED ORDER — DIPHENHYDRAMINE HCL 12.5 MG/5ML PO ELIX: 25.0000 mg | ORAL_SOLUTION | ORAL | Status: DC | PRN

## 2019-06-02 MED ORDER — QUETIAPINE FUMARATE 50 MG PO TABS
50.0000 mg | ORAL_TABLET | Freq: Every day | ORAL | Status: DC
  Administered 2019-06-02: 50 mg via ORAL
  Filled 2019-06-02 (×2): qty 1
  Filled 2019-06-02: qty 2

## 2019-06-02 MED ORDER — ACETAMINOPHEN 500 MG PO TABS
1000.0000 mg | ORAL_TABLET | Freq: Once | ORAL | Status: AC
  Administered 2019-06-02: 1000 mg via ORAL
  Filled 2019-06-02: qty 2

## 2019-06-02 MED ORDER — VANCOMYCIN HCL IN DEXTROSE 1-5 GM/200ML-% IV SOLN
INTRAVENOUS | Status: AC
  Administered 2019-06-03: 1000 mg via INTRAVENOUS
  Filled 2019-06-02: qty 200

## 2019-06-02 MED ORDER — CELECOXIB 200 MG PO CAPS
200.0000 mg | ORAL_CAPSULE | Freq: Two times a day (BID) | ORAL | Status: DC
  Administered 2019-06-02 – 2019-06-03 (×3): 200 mg via ORAL
  Filled 2019-06-02 (×3): qty 1

## 2019-06-02 MED ORDER — ATORVASTATIN CALCIUM 40 MG PO TABS
40.0000 mg | ORAL_TABLET | Freq: Every day | ORAL | Status: DC
  Administered 2019-06-02 – 2019-06-03 (×2): 40 mg via ORAL
  Filled 2019-06-02 (×2): qty 1

## 2019-06-02 MED ORDER — TRANEXAMIC ACID-NACL 1000-0.7 MG/100ML-% IV SOLN
1000.0000 mg | Freq: Once | INTRAVENOUS | Status: AC
  Administered 2019-06-02: 1000 mg via INTRAVENOUS
  Filled 2019-06-02: qty 100

## 2019-06-02 MED ORDER — MIDAZOLAM HCL 2 MG/2ML IJ SOLN
INTRAMUSCULAR | Status: AC
  Filled 2019-06-02: qty 2

## 2019-06-02 MED ORDER — MAGNESIUM CITRATE PO SOLN: 1.0000 | Freq: Once | ORAL | Status: DC | PRN

## 2019-06-02 MED ORDER — ACETAMINOPHEN 325 MG PO TABS: 325.0000 mg | ORAL_TABLET | Freq: Four times a day (QID) | ORAL | Status: DC | PRN

## 2019-06-02 MED ORDER — LACTATED RINGERS IV SOLN
INTRAVENOUS | Status: DC
  Administered 2019-06-02: 10:00:00 via INTRAVENOUS

## 2019-06-02 MED ORDER — OXYCODONE HCL 5 MG PO TABS
10.0000 mg | ORAL_TABLET | ORAL | Status: DC | PRN
  Administered 2019-06-03 (×2): 10 mg via ORAL
  Filled 2019-06-02: qty 2

## 2019-06-02 MED ORDER — SODIUM CHLORIDE 0.9 % IV SOLN
INTRAVENOUS | Status: DC | PRN
  Administered 2019-06-02: 11:00:00 50 ug/min via INTRAVENOUS

## 2019-06-02 MED ORDER — ONDANSETRON HCL 4 MG PO TABS: 4.0000 mg | ORAL_TABLET | Freq: Three times a day (TID) | ORAL | 0 refills | Status: DC | PRN

## 2019-06-02 MED ORDER — OXYCODONE HCL 5 MG PO TABS: 5.0000 mg | ORAL_TABLET | Freq: Three times a day (TID) | ORAL | 0 refills | Status: DC | PRN

## 2019-06-02 MED ORDER — FENTANYL CITRATE (PF) 100 MCG/2ML IJ SOLN: 25.0000 ug | INTRAMUSCULAR | Status: DC | PRN

## 2019-06-02 MED ORDER — FENTANYL CITRATE (PF) 100 MCG/2ML IJ SOLN
INTRAMUSCULAR | Status: AC
  Administered 2019-06-02: 10:00:00 100 ug
  Filled 2019-06-02: qty 2

## 2019-06-02 MED ORDER — KETOROLAC TROMETHAMINE 15 MG/ML IJ SOLN
30.0000 mg | Freq: Four times a day (QID) | INTRAMUSCULAR | Status: AC
  Administered 2019-06-02 – 2019-06-03 (×4): 30 mg via INTRAVENOUS
  Filled 2019-06-02 (×4): qty 2

## 2019-06-02 MED ORDER — VANCOMYCIN HCL IN DEXTROSE 1-5 GM/200ML-% IV SOLN
1000.0000 mg | Freq: Two times a day (BID) | INTRAVENOUS | Status: AC
  Administered 2019-06-02 – 2019-06-03 (×2): 1000 mg via INTRAVENOUS
  Filled 2019-06-02 (×2): qty 200

## 2019-06-02 MED ORDER — DEXAMETHASONE SODIUM PHOSPHATE 10 MG/ML IJ SOLN
INTRAMUSCULAR | Status: DC | PRN
  Administered 2019-06-02: 5 mg

## 2019-06-02 MED ORDER — PHENYLEPHRINE HCL (PRESSORS) 10 MG/ML IV SOLN
INTRAVENOUS | Status: DC | PRN
  Administered 2019-06-02: 80 ug via INTRAVENOUS

## 2019-06-02 MED ORDER — ONDANSETRON HCL 4 MG/2ML IJ SOLN
INTRAMUSCULAR | Status: DC | PRN
  Administered 2019-06-02: 4 mg via INTRAVENOUS

## 2019-06-02 MED ORDER — ONDANSETRON HCL 4 MG/2ML IJ SOLN
4.0000 mg | Freq: Four times a day (QID) | INTRAMUSCULAR | Status: DC | PRN
  Administered 2019-06-02: 4 mg via INTRAVENOUS
  Filled 2019-06-02: qty 2

## 2019-06-02 MED ORDER — DEXAMETHASONE SODIUM PHOSPHATE 10 MG/ML IJ SOLN
10.0000 mg | Freq: Once | INTRAMUSCULAR | Status: AC
  Administered 2019-06-03: 10 mg via INTRAVENOUS
  Filled 2019-06-02: qty 1

## 2019-06-02 MED ORDER — ONDANSETRON HCL 4 MG/2ML IJ SOLN
INTRAMUSCULAR | Status: AC
  Filled 2019-06-02: qty 4

## 2019-06-02 MED ORDER — SODIUM CHLORIDE 0.9 % IV SOLN
INTRAVENOUS | Status: DC
  Administered 2019-06-02 – 2019-06-03 (×2): via INTRAVENOUS

## 2019-06-02 MED ORDER — SORBITOL 70 % SOLN: 30.0000 mL | Freq: Every day | Status: DC | PRN

## 2019-06-02 MED ORDER — LACTATED RINGERS IV SOLN
INTRAVENOUS | Status: DC | PRN
  Administered 2019-06-02 (×2): via INTRAVENOUS

## 2019-06-02 MED ORDER — ACETAMINOPHEN 500 MG PO TABS
1000.0000 mg | ORAL_TABLET | Freq: Four times a day (QID) | ORAL | Status: AC
  Administered 2019-06-02 – 2019-06-03 (×4): 1000 mg via ORAL
  Filled 2019-06-02 (×3): qty 2

## 2019-06-02 MED ORDER — PHENOL 1.4 % MT LIQD: 1.0000 | OROMUCOSAL | Status: DC | PRN

## 2019-06-02 MED ORDER — FENTANYL CITRATE (PF) 250 MCG/5ML IJ SOLN
INTRAMUSCULAR | Status: AC
  Filled 2019-06-02: qty 5

## 2019-06-02 MED ORDER — MENTHOL 3 MG MT LOZG: 1.0000 | LOZENGE | OROMUCOSAL | Status: DC | PRN

## 2019-06-02 MED ORDER — PROPOFOL 500 MG/50ML IV EMUL
INTRAVENOUS | Status: DC | PRN
  Administered 2019-06-02: 75 ug/kg/min via INTRAVENOUS

## 2019-06-02 MED ORDER — SODIUM CHLORIDE 0.9 % IR SOLN
Status: DC | PRN
  Administered 2019-06-02: 3000 mL

## 2019-06-02 MED ORDER — DOCUSATE SODIUM 100 MG PO CAPS
100.0000 mg | ORAL_CAPSULE | Freq: Two times a day (BID) | ORAL | Status: DC
  Administered 2019-06-02 – 2019-06-03 (×3): 100 mg via ORAL
  Filled 2019-06-02 (×3): qty 1

## 2019-06-02 MED ORDER — VANCOMYCIN HCL 1000 MG IV SOLR
INTRAVENOUS | Status: DC | PRN
  Administered 2019-06-02: 1000 mg

## 2019-06-02 MED ORDER — DULOXETINE HCL 30 MG PO CPEP
30.0000 mg | ORAL_CAPSULE | Freq: Every day | ORAL | Status: DC
  Administered 2019-06-02 – 2019-06-03 (×2): 30 mg via ORAL
  Filled 2019-06-02 (×2): qty 1

## 2019-06-02 MED ORDER — PROPOFOL 10 MG/ML IV BOLUS
INTRAVENOUS | Status: DC | PRN
  Administered 2019-06-02 (×2): 20 mg via INTRAVENOUS

## 2019-06-02 MED ORDER — ROPIVACAINE HCL 5 MG/ML IJ SOLN
INTRAMUSCULAR | Status: DC | PRN
  Administered 2019-06-02: 20 mL via PERINEURAL

## 2019-06-02 MED ORDER — POLYETHYLENE GLYCOL 3350 17 G PO PACK: 17.0000 g | PACK | Freq: Every day | ORAL | Status: DC | PRN

## 2019-06-02 MED ORDER — METHOCARBAMOL 750 MG PO TABS: 750.0000 mg | ORAL_TABLET | Freq: Two times a day (BID) | ORAL | 0 refills | Status: DC | PRN

## 2019-06-02 MED ORDER — FENTANYL CITRATE (PF) 100 MCG/2ML IJ SOLN
INTRAMUSCULAR | Status: DC | PRN
  Administered 2019-06-02: 50 ug via INTRAVENOUS

## 2019-06-02 MED ORDER — SODIUM CHLORIDE 0.9 % IV SOLN
INTRAVENOUS | Status: DC | PRN
  Administered 2019-06-02: 60 mL

## 2019-06-02 MED ORDER — PROPOFOL 1000 MG/100ML IV EMUL
INTRAVENOUS | Status: AC
  Filled 2019-06-02: qty 100

## 2019-06-02 MED ORDER — MIDAZOLAM HCL 2 MG/2ML IJ SOLN
INTRAMUSCULAR | Status: AC
  Administered 2019-06-02: 2 mg
  Filled 2019-06-02: qty 2

## 2019-06-02 MED ORDER — POVIDONE-IODINE 10 % EX SWAB: 2.0000 "application " | Freq: Once | CUTANEOUS | Status: DC

## 2019-06-02 MED ORDER — ONDANSETRON HCL 4 MG PO TABS: 4.0000 mg | ORAL_TABLET | Freq: Four times a day (QID) | ORAL | Status: DC | PRN

## 2019-06-02 MED ORDER — METHOCARBAMOL 500 MG PO TABS
500.0000 mg | ORAL_TABLET | Freq: Four times a day (QID) | ORAL | Status: DC | PRN
  Administered 2019-06-02 – 2019-06-03 (×2): 500 mg via ORAL
  Filled 2019-06-02 (×2): qty 1

## 2019-06-02 MED ORDER — GABAPENTIN 300 MG PO CAPS
300.0000 mg | ORAL_CAPSULE | Freq: Three times a day (TID) | ORAL | Status: DC
  Administered 2019-06-02 – 2019-06-03 (×3): 300 mg via ORAL
  Filled 2019-06-02 (×3): qty 1

## 2019-06-02 MED ORDER — OXYCODONE HCL ER 10 MG PO T12A: 10.0000 mg | EXTENDED_RELEASE_TABLET | Freq: Two times a day (BID) | ORAL | 0 refills | Status: AC

## 2019-06-02 MED ORDER — SENNOSIDES-DOCUSATE SODIUM 8.6-50 MG PO TABS: 1.0000 | ORAL_TABLET | Freq: Every evening | ORAL | 1 refills | Status: DC | PRN

## 2019-06-02 MED ORDER — VANCOMYCIN HCL 1000 MG IV SOLR
INTRAVENOUS | Status: AC
  Filled 2019-06-02: qty 1000

## 2019-06-02 MED ORDER — METOCLOPRAMIDE HCL 5 MG/ML IJ SOLN: 5.0000 mg | Freq: Three times a day (TID) | INTRAMUSCULAR | Status: DC | PRN

## 2019-06-02 MED ORDER — HYDROMORPHONE HCL 1 MG/ML IJ SOLN
0.5000 mg | INTRAMUSCULAR | Status: DC | PRN
  Administered 2019-06-02: 0.5 mg via INTRAVENOUS
  Administered 2019-06-03: 1 mg via INTRAVENOUS
  Filled 2019-06-02 (×2): qty 1

## 2019-06-02 MED ORDER — OXYCODONE HCL 5 MG PO TABS
5.0000 mg | ORAL_TABLET | ORAL | Status: DC | PRN
  Administered 2019-06-02: 10 mg via ORAL
  Filled 2019-06-02 (×2): qty 2

## 2019-06-02 MED ORDER — ASPIRIN-DIPYRIDAMOLE ER 25-200 MG PO CP12
1.0000 | ORAL_CAPSULE | Freq: Every day | ORAL | Status: DC
  Administered 2019-06-02 – 2019-06-03 (×2): 1 via ORAL
  Filled 2019-06-02 (×3): qty 1

## 2019-06-02 MED ORDER — ASPIRIN 81 MG PO CHEW
81.0000 mg | CHEWABLE_TABLET | Freq: Two times a day (BID) | ORAL | Status: DC
  Administered 2019-06-02 – 2019-06-03 (×2): 81 mg via ORAL
  Filled 2019-06-02 (×2): qty 1

## 2019-06-02 SURGICAL SUPPLY — 83 items
ALCOHOL 70% 16 OZ (MISCELLANEOUS) ×2 IMPLANT
BAG DECANTER FOR FLEXI CONT (MISCELLANEOUS) ×2 IMPLANT
BANDAGE ESMARK 6X9 LF (GAUZE/BANDAGES/DRESSINGS) ×1 IMPLANT
BLADE SAW SAG 90X13X1.27 (BLADE) ×1 IMPLANT
BLADE SAW SGTL 13.0X1.19X90.0M (BLADE) ×2 IMPLANT
BNDG ELASTIC 6X10 VLCR STRL LF (GAUZE/BANDAGES/DRESSINGS) ×2 IMPLANT
BNDG ESMARK 6X9 LF (GAUZE/BANDAGES/DRESSINGS) ×2
BOWL SMART MIX CTS (DISPOSABLE) ×2 IMPLANT
CEMENT BONE REFOBACIN R1X40 US (Cement) ×2 IMPLANT
CLSR STERI-STRIP ANTIMIC 1/2X4 (GAUZE/BANDAGES/DRESSINGS) ×4 IMPLANT
COVER SURGICAL LIGHT HANDLE (MISCELLANEOUS) ×2 IMPLANT
COVER WAND RF STERILE (DRAPES) ×2 IMPLANT
CUFF TOURN SGL QUICK 34 (TOURNIQUET CUFF) ×1
CUFF TOURN SGL QUICK 42 (TOURNIQUET CUFF) IMPLANT
CUFF TRNQT CYL 34X4.125X (TOURNIQUET CUFF) ×1 IMPLANT
DRAPE EXTREMITY T 121X128X90 (DISPOSABLE) ×2 IMPLANT
DRAPE HALF SHEET 40X57 (DRAPES) ×2 IMPLANT
DRAPE INCISE IOBAN 66X45 STRL (DRAPES) IMPLANT
DRAPE ORTHO SPLIT 77X108 STRL (DRAPES) ×2
DRAPE POUCH INSTRU U-SHP 10X18 (DRAPES) ×2 IMPLANT
DRAPE SURG ORHT 6 SPLT 77X108 (DRAPES) ×2 IMPLANT
DRAPE U-SHAPE 47X51 STRL (DRAPES) ×4 IMPLANT
DRILL PIN HEADLESS TROCAR 3X75 (PIN) ×1 IMPLANT
DRSG PAD ABDOMINAL 8X10 ST (GAUZE/BANDAGES/DRESSINGS) ×4 IMPLANT
DURAPREP 26ML APPLICATOR (WOUND CARE) ×4 IMPLANT
ELECT CAUTERY BLADE 6.4 (BLADE) ×2 IMPLANT
ELECT REM PT RETURN 9FT ADLT (ELECTROSURGICAL) ×2
ELECTRODE REM PT RTRN 9FT ADLT (ELECTROSURGICAL) ×1 IMPLANT
FEMUR  CMT CCR STD SZ6 L KNEE (Knees) ×1 IMPLANT
FEMUR CMT CCR STD SZ6 L KNEE (Knees) ×1 IMPLANT
FEMUR CMT CR STD SZ 6 LT KNEE (Joint) ×2 IMPLANT
FEMUR CMTD CCR STD SZ6 L KNEE (Knees) IMPLANT
FEMUR CMTD CR STD SZ 6 LT KNEE (Joint) IMPLANT
GAUZE SPONGE 4X4 12PLY STRL LF (GAUZE/BANDAGES/DRESSINGS) ×2 IMPLANT
GLOVE BIOGEL PI IND STRL 7.0 (GLOVE) ×1 IMPLANT
GLOVE BIOGEL PI INDICATOR 7.0 (GLOVE) ×1
GLOVE ECLIPSE 7.0 STRL STRAW (GLOVE) ×6 IMPLANT
GLOVE SKINSENSE NS SZ7.5 (GLOVE) ×1
GLOVE SKINSENSE STRL SZ7.5 (GLOVE) ×1 IMPLANT
GLOVE SURG SYN 7.5  E (GLOVE) ×4
GLOVE SURG SYN 7.5 E (GLOVE) ×4 IMPLANT
GLOVE SURG SYN 7.5 PF PI (GLOVE) ×4 IMPLANT
GOWN STRL REIN XL XLG (GOWN DISPOSABLE) ×2 IMPLANT
GOWN STRL REUS W/ TWL LRG LVL3 (GOWN DISPOSABLE) ×1 IMPLANT
GOWN STRL REUS W/TWL LRG LVL3 (GOWN DISPOSABLE) ×1
HANDPIECE INTERPULSE COAX TIP (DISPOSABLE) ×1
HOOD PEEL AWAY FLYTE STAYCOOL (MISCELLANEOUS) ×4 IMPLANT
INSERT TIBIAL PLY L EF6-9X12 (Knees) ×1 IMPLANT
KIT BASIN OR (CUSTOM PROCEDURE TRAY) ×2 IMPLANT
KIT TURNOVER KIT B (KITS) ×2 IMPLANT
MANIFOLD NEPTUNE II (INSTRUMENTS) ×2 IMPLANT
MARKER SKIN DUAL TIP RULER LAB (MISCELLANEOUS) ×2 IMPLANT
NDL SPNL 18GX3.5 QUINCKE PK (NEEDLE) ×2 IMPLANT
NEEDLE SPNL 18GX3.5 QUINCKE PK (NEEDLE) ×4 IMPLANT
NS IRRIG 1000ML POUR BTL (IV SOLUTION) ×2 IMPLANT
PACK TOTAL JOINT (CUSTOM PROCEDURE TRAY) ×2 IMPLANT
PAD ARMBOARD 7.5X6 YLW CONV (MISCELLANEOUS) ×4 IMPLANT
PAD CAST 3X4 CTTN HI CHSV (CAST SUPPLIES) IMPLANT
PAD CAST 4YDX4 CTTN HI CHSV (CAST SUPPLIES) ×1 IMPLANT
PADDING CAST COTTON 3X4 STRL (CAST SUPPLIES) ×1
PADDING CAST COTTON 4X4 STRL (CAST SUPPLIES) ×1
PADDING CAST COTTON 6X4 STRL (CAST SUPPLIES) ×2 IMPLANT
SAW OSC TIP CART 19.5X105X1.3 (SAW) ×3 IMPLANT
SET HNDPC FAN SPRY TIP SCT (DISPOSABLE) ×1 IMPLANT
STAPLER VISISTAT 35W (STAPLE) IMPLANT
STEM POLY PAT PLY 29M KNEE (Knees) ×1 IMPLANT
STEM TIBIA 5 DEG SZ E L KNEE (Knees) IMPLANT
SUCTION FRAZIER HANDLE 10FR (MISCELLANEOUS) ×1
SUCTION TUBE FRAZIER 10FR DISP (MISCELLANEOUS) ×1 IMPLANT
SUT ETHILON 2 0 FS 18 (SUTURE) ×5 IMPLANT
SUT MNCRL AB 4-0 PS2 18 (SUTURE) IMPLANT
SUT VIC AB 0 CT1 27 (SUTURE) ×2
SUT VIC AB 0 CT1 27XBRD ANBCTR (SUTURE) ×2 IMPLANT
SUT VIC AB 1 CTX 27 (SUTURE) ×7 IMPLANT
SUT VIC AB 2-0 CT1 27 (SUTURE) ×6
SUT VIC AB 2-0 CT1 TAPERPNT 27 (SUTURE) ×4 IMPLANT
SYR 50ML LL SCALE MARK (SYRINGE) ×4 IMPLANT
TIBIA STEM 5 DEG SZ E L KNEE (Knees) ×2 IMPLANT
TOWEL GREEN STERILE (TOWEL DISPOSABLE) ×2 IMPLANT
TOWEL GREEN STERILE FF (TOWEL DISPOSABLE) ×2 IMPLANT
TRAY CATH 16FR W/PLASTIC CATH (SET/KITS/TRAYS/PACK) IMPLANT
UNDERPAD 30X30 (UNDERPADS AND DIAPERS) ×2 IMPLANT
WRAP KNEE MAXI GEL POST OP (GAUZE/BANDAGES/DRESSINGS) ×2 IMPLANT

## 2019-06-02 NOTE — Evaluation (Signed)
Physical Therapy Evaluation Patient Details Name: Judy Martinez MRN: [id number] DOB: June 28, 1951 Today's Date: 06/02/2019   History of Present Illness  Pt is a 68 y/o female s/p L TKA. PMH includes HTN, CVA, and MI.   Clinical Impression  Pt s/p surgery above with deficits below. Pt with 10/10 pain which limited mobility. Required min A for mobility to chair this session using RW. Reviewed knee precautions, however, pt unable to tolerate supine HEP. Will continue to follow acutely to maximize functional mobility independence and safety.     Follow Up Recommendations Follow surgeon's recommendation for DC plan and follow-up therapies;Supervision for mobility/OOB    Equipment Recommendations  Rolling walker with 5" wheels;3in1 (PT)    Recommendations for Other Services       Precautions / Restrictions Precautions Precautions: Knee Precaution Booklet Issued: No Precaution Comments: Verbally reviewed knee precautions with pt.  Restrictions Weight Bearing Restrictions: Yes LLE Weight Bearing: Weight bearing as tolerated      Mobility  Bed Mobility Overal bed mobility: Needs Assistance Bed Mobility: Supine to Sit     Supine to sit: Min assist     General bed mobility comments: Min A for LLE assist. Increased time to come to EOB.   Transfers Overall transfer level: Needs assistance Equipment used: Rolling walker (2 wheeled) Transfers: Sit to/from Stand Sit to Stand: Min assist         General transfer comment: Performed sit<>Stand X 2 as pt initially dizzy. Symptoms improved with seated rest. Very limited weightshift to LLE in standing secondary to pain.   Ambulation/Gait Ambulation/Gait assistance: Min assist Gait Distance (Feet): 5 Feet Assistive device: Rolling walker (2 wheeled) Gait Pattern/deviations: Step-to pattern;Decreased step length - right;Decreased step length - left;Decreased weight shift to left;Antalgic Gait velocity: Decreased   General Gait  Details: Slow, very antalgic gait. Cues for sequencing using RW. Very limited weightshift to LLE secondary to pain. Further distance limited secondary to pain.   Stairs            Wheelchair Mobility    Modified Rankin (Stroke Patients Only)       Balance Overall balance assessment: Needs assistance Sitting-balance support: No upper extremity supported;Feet supported Sitting balance-Leahy Scale: Fair     Standing balance support: Bilateral upper extremity supported;During functional activity Standing balance-Leahy Scale: Poor Standing balance comment: Reliant on BUE support                              Pertinent Vitals/Pain Pain Assessment: 0-10 Pain Score: 10-Worst pain ever Pain Location: L knee Pain Descriptors / Indicators: Operative site guarding;Guarding;Grimacing Pain Intervention(s): Limited activity within patient's tolerance;Monitored during session;Repositioned;Patient requesting pain meds-RN notified;RN gave pain meds during session    Home Living Family/patient expects to be discharged to:: Private residence Living Arrangements: Spouse/significant other Available Help at Discharge: Family;Neighbor Type of Home: House Home Access: Stairs to enter Entrance Stairs-Rails: Right Entrance Stairs-Number of Steps: 1 Home Layout: One level Home Equipment: Cane - single point      Prior Function Level of Independence: Independent with assistive device(s)         Comments: Used cane for ambulation.      Hand Dominance        Extremity/Trunk Assessment   Upper Extremity Assessment Upper Extremity Assessment: Overall WFL for tasks assessed    Lower Extremity Assessment Lower Extremity Assessment: LLE deficits/detail LLE Deficits / Details: Deficits consistent with post op pain and weakness.  Very limited ROM secondary to pain.     Cervical / Trunk Assessment Cervical / Trunk Assessment: Normal  Communication   Communication: No  difficulties  Cognition Arousal/Alertness: Awake/alert Behavior During Therapy: WFL for tasks assessed/performed Overall Cognitive Status: Within Functional Limits for tasks assessed                                        General Comments      Exercises     Assessment/Plan    PT Assessment Patient needs continued PT services  PT Problem List Decreased strength;Decreased range of motion;Decreased activity tolerance;Decreased balance;Decreased mobility;Decreased knowledge of use of DME;Decreased knowledge of precautions;Pain       PT Treatment Interventions DME instruction;Gait training;Stair training;Functional mobility training;Therapeutic activities;Balance training;Therapeutic exercise;Patient/family education    PT Goals (Current goals can be found in the Care Plan section)  Acute Rehab PT Goals Patient Stated Goal: to decrease pain  PT Goal Formulation: With patient Time For Goal Achievement: 06/16/19 Potential to Achieve Goals: Good    Frequency 7X/week   Barriers to discharge        Co-evaluation               AM-PAC PT "6 Clicks" Mobility  Outcome Measure Help needed turning from your back to your side while in a flat bed without using bedrails?: A Little Help needed moving from lying on your back to sitting on the side of a flat bed without using bedrails?: A Little Help needed moving to and from a bed to a chair (including a wheelchair)?: A Little Help needed standing up from a chair using your arms (e.g., wheelchair or bedside chair)?: A Little Help needed to walk in hospital room?: A Little Help needed climbing 3-5 steps with a railing? : A Lot 6 Click Score: 17    End of Session Equipment Utilized During Treatment: Gait belt Activity Tolerance: Patient limited by pain Patient left: in chair;with call bell/phone within reach Nurse Communication: Mobility status;Other (comment)(pt requesting nausea meds) PT Visit Diagnosis:  Unsteadiness on feet (R26.81);Difficulty in walking, not elsewhere classified (R26.2);Pain Pain - Right/Left: Left Pain - part of body: Knee    Time: [id number] PT Time Calculation (min) (ACUTE ONLY): 19 min   Charges:   PT Evaluation $PT Eval Low Complexity: Carmine, PT, DPT  Acute Rehabilitation Services  Pager: [phone] Office: [phone]   Rudean Hitt 06/02/2019, 6:11 PM

## 2019-06-02 NOTE — Plan of Care (Signed)

## 2019-06-02 NOTE — Discharge Instructions (Signed)

## 2019-06-02 NOTE — H&P (Signed)
PREOPERATIVE H&P  Chief Complaint: left knee degenerative joint disease  HPI: Judy Martinez is a 68 y.o. female who presents for surgical treatment of left knee degenerative joint disease.  She denies any changes in medical history.  Past Medical History:  Diagnosis Date  . Arthritis   . Heart attack ([place]) 2004  . Hypertension   . Stroke [facility]) 2015   Past Surgical History:  Procedure Laterality Date  . ABDOMINAL HYSTERECTOMY    . TUBAL LIGATION     Social History   Socioeconomic History  . Marital status: Legally Separated    Spouse name: Not on file  . Number of children: Not on file  . Years of education: Not on file  . Highest education level: Not on file  Occupational History  . Not on file  Social Needs  . Financial resource strain: Not on file  . Food insecurity    Worry: Not on file    Inability: Not on file  . Transportation needs    Medical: Not on file    Non-medical: Not on file  Tobacco Use  . Smoking status: Current Every Day Smoker    Packs/day: 0.50    Years: 15.00    Pack years: 7.50    Types: Cigarettes  . Smokeless tobacco: Never Used  Substance and Sexual Activity  . Alcohol use: Yes    Comment: occ beer  . Drug use: Not Currently    Frequency: 2.0 times per week    Types: Cocaine, Marijuana    Comment: last used cocaine few days ago, marijuana last used 2-3 weeks ago  . Sexual activity: Yes    Birth control/protection: Surgical  Lifestyle  . Physical activity    Days per week: Not on file    Minutes per session: Not on file  . Stress: Not on file  Relationships  . Social Herbalist on phone: Not on file    Gets together: Not on file    Attends religious service: Not on file    Active member of club or organization: Not on file    Attends meetings of clubs or organizations: Not on file    Relationship status: Not on file  Other Topics Concern  . Not on file  Social History Narrative  . Not on file   Family  History  Problem Relation Age of Onset  . Alcohol abuse Father    Allergies  Allergen Reactions  . Penicillins Anaphylaxis and Hives    Did it involve swelling of the face/tongue/throat, SOB, or low BP? Yes Did it involve sudden or severe rash/hives, skin peeling, or any reaction on the inside of your mouth or nose? No Did you need to seek medical attention at a hospital or doctor's office? Yes When did it last happen?Over 10 years ago If all above answers are "NO", may proceed with cephalosporin use.    Prior to Admission medications   Medication Sig Start Date End Date Taking? Authorizing Provider  acetaminophen (TYLENOL) 325 MG tablet Take 325-650 mg by mouth See admin instructions. Take 650mg  in the AM and 325mg  in the PM.   Yes [provider]  aspirin EC 81 MG tablet Take 81 mg by mouth daily.   Yes [provider]  atorvastatin (LIPITOR) 40 MG tablet Take 1 tablet (40 mg total) by mouth daily. 04/28/19  Yes Gildardo Pounds, NP  dipyridamole-aspirin (AGGRENOX) 200-25 MG 12hr capsule TAKE 1 CAPSULE BY MOUTH DAILY  05/29/19  Yes Newlin, Charlane Ferretti, MD  DULoxetine (CYMBALTA) 30 MG capsule TAKE 1 CAPSULE(30 MG) BY MOUTH DAILY Patient taking differently: Take 30 mg by mouth daily.  04/28/19  Yes Gildardo Pounds, NP  lisinopril (ZESTRIL) 20 MG tablet Take 1 tablet (20 mg total) by mouth daily. 04/28/19  Yes Gildardo Pounds, NP  QUEtiapine (SEROQUEL) 50 MG tablet Take 1 tablet (50 mg total) by mouth at bedtime. 04/28/19  Yes Gildardo Pounds, NP  vitamin B-12 (CYANOCOBALAMIN) 1000 MCG tablet Take 1,000 mcg by mouth every 7 (seven) days.   Yes [provider]  albuterol (VENTOLIN HFA) 108 (90 Base) MCG/ACT inhaler INHALE 2 PUFFS INTO THE LUNGS EVERY 6 HOURS AS NEEDED FOR WHEEZING OR SHORTNESS OF BREATH 05/26/19   Charlott Rakes, MD  Vitamin D, Ergocalciferol, (DRISDOL) 1.25 MG (50000 UT) CAPS capsule TAKE 1 CAPSULE BY MOUTH 1 TIME A WEEK Patient not taking:  Reported on 10/29/2018 10/01/18   Rodriguez-Southworth, Sunday Spillers, PA-C     Positive ROS: All other systems have been reviewed and were otherwise negative with the exception of those mentioned in the HPI and as above.  Physical Exam: General: Alert, no acute distress Cardiovascular: No pedal edema Respiratory: No cyanosis, no use of accessory musculature GI: abdomen soft Skin: No lesions in the area of chief complaint Neurologic: Sensation intact distally Psychiatric: Patient is competent for consent with normal mood and affect Lymphatic: no lymphedema  MUSCULOSKELETAL: exam stable  Assessment: left knee degenerative joint disease  Plan: Plan for Procedure(s): LEFT TOTAL KNEE ARTHROPLASTY  The risks benefits and alternatives were discussed with the patient including but not limited to the risks of nonoperative treatment, versus surgical intervention including infection, bleeding, nerve injury,  blood clots, cardiopulmonary complications, morbidity, mortality, among others, and they were willing to proceed.   Preoperative templating of the joint replacement has been completed, documented, and submitted to the Operating Room personnel in order to optimize intra-operative equipment management.  Patient's anticipated LOS is less than 2 midnights, meeting these requirements: - Younger than 52 - Lives within 1 hour of care - Has a competent adult at home to recover with post-op recover - NO history of  - Chronic pain requiring opiods  - Diabetes  - Coronary Artery Disease  - Heart failure  - Heart attack  - Stroke  - DVT/VTE  - Cardiac arrhythmia  - Respiratory Failure/COPD  - Renal failure  - Anemia  - Advanced Liver disease  Eduard Roux, MD   06/02/2019 10:10 AM

## 2019-06-02 NOTE — Anesthesia Procedure Notes (Signed)
Procedure Name: MAC Date/Time: 06/02/2019 11:00 AM Performed by: Neldon Newport, CRNA Pre-anesthesia Checklist: Patient identified, Emergency Drugs available, Suction available, Patient being monitored and Timeout performed Patient Re-evaluated:Patient Re-evaluated prior to induction Oxygen Delivery Method: Simple face mask

## 2019-06-02 NOTE — Op Note (Signed)
Total Knee Arthroplasty Procedure Note  Preoperative diagnosis: Left knee osteoarthritis  Postoperative diagnosis:same  Operative procedure: Left total knee arthroplasty. CPT [phone]  Surgeon: N. Eduard Roux, MD  Assist: Madalyn Rob, PA-C; necessary for the timely completion of procedure and due to complexity of procedure.  Anesthesia: Spinal, regional  Tourniquet time: see anesthesia record  Implants used: Zimmer persona Femur: PS 6 Tibia: E Patella: 29 mm Polyethylene: 12 mm, PS  Indication: Judy Martinez is a 68 y.o. year old female with a history of knee pain. Having failed conservative management, the patient elected to proceed with a total knee arthroplasty.  We have reviewed the risk and benefits of the surgery and they elected to proceed after voicing understanding.  Procedure:  After informed consent was obtained and understanding of the risk were voiced including but not limited to bleeding, infection, damage to surrounding structures including nerves and vessels, blood clots, leg length inequality and the failure to achieve desired results, the operative extremity was marked with verbal confirmation of the patient in the holding area.   The patient was then brought to the operating room and transported to the operating room table in the supine position.  A tourniquet was applied to the operative extremity around the upper thigh. The operative limb was then prepped and draped in the usual sterile fashion and preoperative antibiotics were administered.  A time out was performed prior to the start of surgery confirming the correct extremity, preoperative antibiotic administration, as well as team members, implants and instruments available for the case. Correct surgical site was also confirmed with preoperative radiographs. The limb was then elevated for exsanguination and the tourniquet was inflated. A midline incision was made and a standard medial parapatellar  approach was performed.  The patella was prepared and sized to a 32 mm.  A cover was placed on the patella for protection from retractors.  We then turned our attention to the femur. Posterior cruciate ligament was sacrificed. Start site was drilled in the femur and the intramedullary distal femoral cutting guide was placed, set at 5 degrees valgus, taking 10 mm of distal resection. The distal cut was made. Osteophytes were then removed.  Extension gap was then checked. Next, the proximal tibial cutting guide was placed with appropriate slope, varus/valgus alignment and depth of resection. The proximal tibial cut was made. Gap blocks were then used to assess the extension gap and alignment, and appropriate soft tissue releases were performed. Attention was turned back to the femur, which was sized using the sizing guide to a size 6. Appropriate rotation of the femoral component was determined using epicondylar axis, Whiteside's line, and assessing the flexion gap under ligament tension. The appropriate size 4-in-1 cutting block was placed and cuts were made. The PS box was then cut.  Posterior femoral osteophytes and uncapped bone were then removed with the curved osteotome.  Trial components were placed, and stability was checked in full extension, mid-flexion, and deep flexion. Proper tibial rotation was determined and marked.  The patella tracked well without a lateral release. Trial components were then removed and tibial preparation performed.  The tibia was sized for a size E component.  A posterior capsular injection comprising of 20 cc of 1.3% exparel, 20 cc of 0.25% bupivicaine with epi and 20 cc of normal saline was performed for postoperative pain control. The bony surfaces were irrigated with a pulse lavage and then dried. Bone cement was vacuum mixed on the back table, and the final  components sized above were cemented into place. After cement had finished curing, excess cement was removed. The  stability of the construct was re-evaluated throughout a range of motion and found to be acceptable. The trial liner was removed, the knee was copiously irrigated, and the knee was re-evaluated for any excess bone debris. The real polyethylene liner, 12 mm thick, was inserted and checked to ensure the locking mechanism had engaged appropriately. The tourniquet was deflated and hemostasis was achieved. The wound was irrigated with normal saline.  One gram of vancomycin powder was placed in the surgical bed.  Capsular closure was performed with a #1 vicryl, subcutaneous fat closed with a 0 vicryl suture, then subcutaneous tissue closed with interrupted 2.0 vicryl suture. The skin was then closed with a 2.0 nylon. A sterile dressing was applied.  The patient was awakened in the operating room and taken to recovery in stable condition. All sponge, needle, and instrument counts were correct at the end of the case.  Position: supine  Complications: none.  Time Out: performed   Drains/Packing: none  Estimated blood loss: minimal  Returned to Recovery Room: in good condition.   Antibiotics: yes   Mechanical VTE (DVT) Prophylaxis: sequential compression devices, TED thigh-high  Chemical VTE (DVT) Prophylaxis: aspirin  Fluid Replacement  Crystalloid: see anesthesia record Blood: none  FFP: none   Specimens Removed: 1 to pathology   Sponge and Instrument Count Correct? yes   PACU: portable radiograph - knee AP and Lateral   Plan/RTC: Return in 2 weeks for wound check.   Weight Bearing/Load Lower Extremity: full   N. Eduard Roux, MD [place] 12:39 PM

## 2019-06-02 NOTE — Transfer of Care (Signed)
Immediate Anesthesia Transfer of Care Note  Patient: Judy Martinez  Procedure(s) Performed: LEFT TOTAL KNEE ARTHROPLASTY (Left Knee)  Patient Location: PACU  Anesthesia Type:MAC and Spinal  Level of Consciousness: awake, alert  and oriented  Airway & Oxygen Therapy: Patient Spontanous Breathing  Post-op Assessment: Report given to RN, Post -op Vital signs reviewed and stable and Patient moving all extremities X 4  Post vital signs: Reviewed and stable  Last Vitals:  Vitals Value Taken Time  BP 97/72 06/02/19 1322  Temp    Pulse 82 06/02/19 1323  Resp 13 06/02/19 1323  SpO2 100 % 06/02/19 1323  Vitals shown include unvalidated device data.  Last Pain:  Vitals:   06/02/19 0943  TempSrc:   PainSc: 6       Patients Stated Pain Goal: 3 (53/74/82 [id number])  Complications: No apparent anesthesia complications

## 2019-06-02 NOTE — Care Management (Signed)
CM consult acknowledged to assist with any HH/DME needs. Awaiting PT/OT eval for DCP recommendations and will continue to follow.  Eshawn Coor RN, BSN, NCM-BC, ACM-RN [phone] 

## 2019-06-02 NOTE — Anesthesia Procedure Notes (Addendum)
Anesthesia Regional Block: Adductor canal block   Pre-Anesthetic Checklist: ,, timeout performed, Correct Patient, Correct Site, Correct Laterality, Correct Procedure, Correct Position, site marked, Risks and benefits discussed,  Surgical consent,  Pre-op evaluation,  At surgeon's request and post-op pain management  Laterality: Left  Prep: Maximum Sterile Barrier Precautions used, chloraprep       Needles:  Injection technique: Single-shot  Needle Type: Echogenic Stimulator Needle     Needle Length: 9cm  Needle Gauge: 22     Additional Needles:   Procedures:,,,, ultrasound used (permanent image in chart),,,,  Narrative:  Start time: 06/02/2019 10:00 AM End time: 06/02/2019 10:10 AM Injection made incrementally with aspirations every 5 mL.  Performed by: Personally  Anesthesiologist: Freddrick March, MD  Additional Notes: Monitors applied. No increased pain on injection. No increased resistance to injection. Injection made in 5cc increments. Good needle visualization. Patient tolerated procedure well.

## 2019-06-03 ENCOUNTER — Encounter: Payer: 59 | Admitting: Nurse Practitioner

## 2019-06-03 ENCOUNTER — Encounter (HOSPITAL_COMMUNITY): Payer: Self-pay | Admitting: General Practice

## 2019-06-03 DIAGNOSIS — M1712 Unilateral primary osteoarthritis, left knee: Secondary | ICD-10-CM | POA: Diagnosis not present

## 2019-06-03 LAB — CBC
HCT: 30.4 % — ABNORMAL LOW (ref 36.0–46.0)
Hemoglobin: 9.6 g/dL — ABNORMAL LOW (ref 12.0–15.0)
MCH: 31.6 pg (ref 26.0–34.0)
MCHC: 31.6 g/dL (ref 30.0–36.0)
MCV: 100 fL (ref 80.0–100.0)
Platelets: 303 10*3/uL (ref 150–400)
RBC: 3.04 MIL/uL — ABNORMAL LOW (ref 3.87–5.11)
RDW: 13.8 % (ref 11.5–15.5)
WBC: 8.9 10*3/uL (ref 4.0–10.5)
nRBC: 0 % (ref 0.0–0.2)

## 2019-06-03 LAB — BASIC METABOLIC PANEL
Anion gap: 9 (ref 5–15)
BUN: 12 mg/dL (ref 8–23)
CO2: 20 mmol/L — ABNORMAL LOW (ref 22–32)
Calcium: 8.3 mg/dL — ABNORMAL LOW (ref 8.9–10.3)
Chloride: 109 mmol/L (ref 98–111)
Creatinine, Ser: 1 mg/dL (ref 0.44–1.00)
GFR calc Af Amer: >60 mL/min (ref >60)
GFR calc non Af Amer: 58 mL/min — ABNORMAL LOW (ref >60)
Glucose, Bld: 171 mg/dL — ABNORMAL HIGH (ref 70–99)
Potassium: 3.9 mmol/L (ref 3.5–5.1)
Sodium: 138 mmol/L (ref 135–145)

## 2019-06-03 MED ORDER — CHLORHEXIDINE GLUCONATE CLOTH 2 % EX PADS
6.0000 | MEDICATED_PAD | Freq: Every day | CUTANEOUS | Status: DC
  Administered 2019-06-03: 10:00:00 6 via TOPICAL

## 2019-06-03 MED ORDER — BUPIVACAINE IN DEXTROSE 0.75-8.25 % IT SOLN
INTRATHECAL | Status: DC | PRN
  Administered 2019-06-02: 1.4 mL via INTRATHECAL

## 2019-06-03 MED ORDER — PNEUMOCOCCAL VAC POLYVALENT 25 MCG/0.5ML IJ INJ
0.5000 mL | INJECTION | INTRAMUSCULAR | Status: AC
  Administered 2019-06-03: 0.5 mL via INTRAMUSCULAR
  Filled 2019-06-03: qty 0.5

## 2019-06-03 NOTE — Progress Notes (Signed)
Physical Therapy Treatment Patient Details Name: Judy Martinez MRN: [id number] DOB: [birth year] Today's Date: 06/03/2019    History of Present Illness Pt is a 68 y/o female s/p L TKA. PMH includes HTN, CVA, and MI.     PT Comments    Pt performed gt training and functional mobility during session this am.  She remains slow and guarded with minor dizziness.  She continues to require min to min guard assistance.  Will f/u in pm to progress functional mobility.      Follow Up Recommendations  Follow surgeon's recommendation for DC plan and follow-up therapies;Supervision for mobility/OOB     Equipment Recommendations  Rolling walker with 5" wheels;3in1 (PT)    Recommendations for Other Services       Precautions / Restrictions Precautions Precautions: Knee Precaution Booklet Issued: No Precaution Comments: Verbally reviewed knee precautions with pt.  Restrictions Weight Bearing Restrictions: Yes LLE Weight Bearing: Weight bearing as tolerated    Mobility  Bed Mobility Overal bed mobility: Needs Assistance Bed Mobility: Supine to Sit;Sit to Supine     Supine to sit: Supervision     General bed mobility comments: Increased time and effort to move from supine to sit.  Transfers Overall transfer level: Needs assistance Equipment used: Rolling walker (2 wheeled) Transfers: Sit to/from Stand Sit to Stand: Min guard         General transfer comment: Cues for hand placement and forward weight shifting to come to standing.  Pt required multiple attempts to achieve correct hand placement and improve ease of transfer.  Ambulation/Gait Ambulation/Gait assistance: Min assist Gait Distance (Feet): 30 Feet Assistive device: Rolling walker (2 wheeled) Gait Pattern/deviations: Step-to pattern;Decreased step length - right;Decreased step length - left;Decreased weight shift to left;Antalgic Gait velocity: Decreased   General Gait Details: Cues for sequencing, UE use to off set  pain and weight shifting to LLE to improve foot clearance on R.  Pt fatigues quickly and required min assistance at time to progress RW.   Stairs             Wheelchair Mobility    Modified Rankin (Stroke Patients Only)       Balance Overall balance assessment: Needs assistance Sitting-balance support: No upper extremity supported;Feet supported Sitting balance-Leahy Scale: Fair     Standing balance support: Bilateral upper extremity supported;During functional activity Standing balance-Leahy Scale: Poor                              Cognition Arousal/Alertness: Awake/alert Behavior During Therapy: WFL for tasks assessed/performed Overall Cognitive Status: Within Functional Limits for tasks assessed                                        Exercises Total Joint Exercises Ankle Circles/Pumps: AROM;Both;20 reps;Supine Quad Sets: AROM;Left;10 reps;Supine Heel Slides: AAROM;Left;10 reps;Supine Hip ABduction/ADduction: AAROM;Left;10 reps;Supine Straight Leg Raises: AAROM;Left;10 reps;Supine Goniometric ROM: will assess this pm.    General Comments        Pertinent Vitals/Pain Pain Assessment: 0-10 Pain Score: 5  Pain Location: L knee Pain Descriptors / Indicators: Operative site guarding;Guarding;Grimacing Pain Intervention(s): Monitored during session;Repositioned    Home Living Family/patient expects to be discharged to:: Private residence Living Arrangements: Spouse/significant other                  Prior Function  PT Goals (current goals can now be found in the care plan section) Acute Rehab PT Goals Patient Stated Goal: to decrease pain  Potential to Achieve Goals: Good Progress towards PT goals: Progressing toward goals    Frequency    7X/week      PT Plan Current plan remains appropriate    Co-evaluation              AM-PAC PT "6 Clicks" Mobility   Outcome Measure  Help needed  turning from your back to your side while in a flat bed without using bedrails?: A Little Help needed moving from lying on your back to sitting on the side of a flat bed without using bedrails?: A Little Help needed moving to and from a bed to a chair (including a wheelchair)?: A Little Help needed standing up from a chair using your arms (e.g., wheelchair or bedside chair)?: A Little Help needed to walk in hospital room?: A Little Help needed climbing 3-5 steps with a railing? : A Lot 6 Click Score: 17    End of Session Equipment Utilized During Treatment: Gait belt Activity Tolerance: Patient limited by pain Patient left: in chair;with call bell/phone within reach Nurse Communication: Mobility status PT Visit Diagnosis: Unsteadiness on feet (R26.81);Difficulty in walking, not elsewhere classified (R26.2);Pain Pain - Right/Left: Left Pain - part of body: Knee     Time: [id number] PT Time Calculation (min) (ACUTE ONLY): 33 min  Charges:  $Gait Training: 8-22 mins $Therapeutic Exercise: 8-22 mins                     Governor Rooks, PTA Acute Rehabilitation Services Pager [phone] Office [phone]     Roxanne Panek Eli Hose 06/03/2019, 12:49 PM

## 2019-06-03 NOTE — Anesthesia Procedure Notes (Signed)
Spinal  Patient location during procedure: OR Start time: 06/02/2019 10:50 AM End time: 06/02/2019 11:00 AM Staffing Anesthesiologist: Freddrick March, MD Performed: anesthesiologist  Preanesthetic Checklist Completed: patient identified, surgical consent, pre-op evaluation, timeout performed, IV checked, risks and benefits discussed and monitors and equipment checked Spinal Block Patient position: sitting Prep: site prepped and draped and DuraPrep Patient monitoring: cardiac monitor, continuous pulse ox and blood pressure Approach: midline Location: L3-4 Injection technique: single-shot Needle Needle type: Pencan  Needle gauge: 24 G Needle length: 9 cm Assessment Sensory level: T6 Additional Notes Functioning IV was confirmed and monitors were applied. Sterile prep and drape, including hand hygiene and sterile gloves were used. The patient was positioned and the spine was prepped. The skin was anesthetized with lidocaine.  Free flow of clear CSF was obtained prior to injecting local anesthetic into the CSF.  The spinal needle aspirated freely following injection.  The needle was carefully withdrawn.  The patient tolerated the procedure well.

## 2019-06-03 NOTE — Progress Notes (Signed)
Orthopedic Tech Progress Note Patient Details:  Judy Martinez 11/10/1950 [id number]  Patient ID: Judy Martinez, female   DOB: September 14, 1950, 68 y.o.   MRN: [id number] Cpm applied at [place]. 0-50  Karolee Stamps 06/03/2019, 6:05 AM

## 2019-06-03 NOTE — Progress Notes (Addendum)
Subjective: 1 Day Post-Op Procedure(s) (LRB): LEFT TOTAL KNEE ARTHROPLASTY (Left) Patient reports pain as mild.  Nausea/vomiting last night.  Feeling much better this am.  Would like to go home today if possible  Objective: Vital signs in last 24 hours: Temp:  [97.5 F (36.4 C)-98.4 F (36.9 C)] 98 F (36.7 C) (09/22 0540) Pulse Rate:  [76-95] 77 (09/22 0540) Resp:  [14-20] 14 (09/22 0540) BP: (97-157)/(69-97) 137/75 (09/22 0540) SpO2:  [93 %-100 %] 100 % (09/22 0540) Weight:  [65.8 kg] 65.8 kg (09/21 0852)  Intake/Output from previous day: 09/21 0701 - 09/22 0700 In: 4882.2 [P.O.:880; I.V.:3602.2; IV Piggyback:400] Out: 2150 [Urine:2100; Blood:50] Intake/Output this shift: No intake/output data recorded.  Recent Labs    06/03/19 0444  HGB 9.6*   Recent Labs    06/03/19 0444  WBC 8.9  RBC 3.04*  HCT 30.4*  PLT 303   Recent Labs    06/03/19 0444  NA 138  K 3.9  CL 109  CO2 20*  BUN 12  CREATININE 1.00  GLUCOSE 171*  CALCIUM 8.3*   No results for input(s): LABPT, INR in the last 72 hours.  Neurologically intact Neurovascular intact Sensation intact distally Intact pulses distally Dorsiflexion/Plantar flexion intact Incision: dressing C/D/I No cellulitis present Compartment soft   Assessment/Plan: 1 Day Post-Op Procedure(s) (LRB): LEFT TOTAL KNEE ARTHROPLASTY (Left) Advance diet Up with therapy Discharge home with home health after second PT session as long as she mobilizes well with PT (please call office for d/c order) WBAT LLE ABLA- mild and stable Dressing changed by me today Please apply thigh high ted hose to LLE D/c foley    Anticipated LOS equal to or greater than 2 midnights due to - Age 1 and older with one or more of the following:  - Obesity  - Expected need for hospital services (PT, OT, Nursing) required for safe  discharge  - Anticipated need for postoperative skilled nursing care or inpatient rehab  - Active co-morbidities:  Stroke OR   - Unanticipated findings during/Post Surgery: Slow post-op progression: GI, pain control, mobility  - Patient is a high risk of re-admission due to: None    Aundra Dubin 06/03/2019, 7:11 AM

## 2019-06-03 NOTE — TOC Transition Note (Addendum)
Transition of Care [facility]) - CM/SW Discharge Note   Patient Details  Name: Judy Martinez MRN: [id number] Date of Birth: 01-01-1951  [facility]) CM/SW Contact:  Midge Minium RN, BSN, NCM-BC, ACM-RN [phone] Phone Number: 06/03/2019, 11:17 AM   Clinical Narrative:    CM following for dispositional needs. Patient is s/p L TKA. CM spoke to patient to discuss her POC. Patient states living at home with her fiancee and being independent with her ADLs PTA; SPC to assist during ambulation. PCP/demo verified. PT eval complete with DME recommended. DME preference list provided with no preference; referral given to Zack ([facility] liaison); AVS updated. Patient had HHPT arranged with [facility] PTA. Patient stated her fiancee will assist post discharge and provide transportation home. No further needs from CM.    Final next level of care: Home/Self Care Barriers to Discharge: No Barriers Identified   Patient Goals and CMS Choice Patient states their goals for this hospitalization and ongoing recovery are:: "to return home" CMS Medicare.gov Compare Post Acute Care list provided to:: Patient Choice offered to / list presented to : Patient  Discharge Plan and Services                DME Arranged: Bedside commode, Walker rolling DME Agency: [facility] Date DME Agency Contacted: 06/03/19 Time DME Agency Contacted: 54 Representative spoke with at DME Agency: [place]: PT [place]: [facility] [facility] [employer]        Social Determinants of Health ([place]) Interventions     Readmission Risk Interventions No flowsheet data found.

## 2019-06-03 NOTE — Anesthesia Postprocedure Evaluation (Signed)
Anesthesia Post Note  Patient: Judy Martinez  Procedure(s) Performed: LEFT TOTAL KNEE ARTHROPLASTY (Left Knee)     Patient location during evaluation: PACU Anesthesia Type: Regional and Spinal Level of consciousness: oriented and awake and alert Pain management: pain level controlled Vital Signs Assessment: post-procedure vital signs reviewed and stable Respiratory status: spontaneous breathing, respiratory function stable and patient connected to nasal cannula oxygen Cardiovascular status: blood pressure returned to baseline and stable Postop Assessment: no headache, no backache and no apparent nausea or vomiting Anesthetic complications: no    Last Vitals:  Vitals:   06/03/19 0540 06/03/19 0819  BP: 137/75 132/76  Pulse: 77 95  Resp: 14 16  Temp: 36.7 C 36.6 C  SpO2: 100% 99%    Last Pain:  Vitals:   06/03/19 0819  TempSrc: Oral  PainSc:    Pain Goal: Patients Stated Pain Goal: 3 (06/03/19 0515)                 Wagner Tanzi L Jezabel Lecker

## 2019-06-03 NOTE — Discharge Summary (Signed)
Patient ID: Judy Martinez MRN: [id number] DOB/AGE: [birth year] 68 y.o.  Admit date: 06/02/2019 Discharge date: 06/03/2019  Admission Diagnoses:  Active Problems:   Primary osteoarthritis of left knee   Status post total left knee replacement   Discharge Diagnoses:  Same  Past Medical History:  Diagnosis Date  . Arthritis   . Heart attack ([place]) 2004  . Hypertension   . Stroke [facility]) 2015    Surgeries: Procedure(s): LEFT TOTAL KNEE ARTHROPLASTY on 06/02/2019   Consultants:   Discharged Condition: Improved  Hospital Course: Judy Martinez is an 68 y.o. female who was admitted 06/02/2019 for operative treatment of left knee osteoarthritis. Patient has severe unremitting pain that affects sleep, daily activities, and work/hobbies. After pre-op clearance the patient was taken to the operating room on 06/02/2019 and underwent  Procedure(s): LEFT TOTAL KNEE ARTHROPLASTY.    Patient was given perioperative antibiotics:  Anti-infectives (From admission, onward)   Start     Dose/Rate Route Frequency Ordered Stop   06/02/19 2100  vancomycin (VANCOCIN) IVPB 1000 mg/200 mL premix     1,000 mg 200 mL/hr over 60 Minutes Intravenous Every 12 hours 06/02/19 1509 06/03/19 2059   06/02/19 1155  vancomycin (VANCOCIN) powder  Status:  Discontinued       As needed 06/02/19 1156 06/02/19 1317   06/02/19 0949  vancomycin (VANCOCIN) 1-5 GM/200ML-% IVPB    Note to Pharmacy: Judy Martinez   : cabinet override      06/02/19 0949 06/02/19 2159   06/02/19 0900  vancomycin (VANCOCIN) IVPB 1000 mg/200 mL premix  Status:  Discontinued     1,000 mg 200 mL/hr over 60 Minutes Intravenous To ShortStay Surgical 05/30/19 1231 06/02/19 1509       Patient was given sequential compression devices, early ambulation, and chemoprophylaxis to prevent DVT.  Patient benefited maximally from hospital stay and there were no complications.    Recent vital signs:  Patient Vitals for the past 24 hrs:  BP Temp Temp src  Pulse Resp SpO2 Height Weight  06/03/19 0540 137/75 98 F (36.7 C) Oral 77 14 100 % - -  06/03/19 0028 (!) 141/91 97.8 F (36.6 C) Oral 95 18 97 % - -  06/02/19 2026 (!) 145/84 97.7 F (36.5 C) Oral 94 14 93 % - -  06/02/19 1513 (!) 157/88 - - 84 - - - -  06/02/19 1512 (!) 154/97 (!) 97.5 F (36.4 C) Oral 84 16 100 % - -  06/02/19 1437 (!) 132/96 - - 84 14 100 % - -  06/02/19 1430 - 97.6 F (36.4 C) - - - - - -  06/02/19 1422 127/81 - - 84 17 100 % - -  06/02/19 1407 129/88 - - 78 17 100 % - -  06/02/19 1352 123/79 - - 76 15 100 % - -  06/02/19 1337 120/69 - - 79 15 100 % - -  06/02/19 1322 97/72 97.6 F (36.4 C) - 78 15 98 % - -  06/02/19 1000 111/72 - - 93 - - - -  06/02/19 0852 (!) 145/85 98.4 F (36.9 C) Oral 94 20 100 % 5' 3.5" (1.613 m) 65.8 kg     Recent laboratory studies:  Recent Labs    06/03/19 0444  WBC 8.9  HGB 9.6*  HCT 30.4*  PLT 303  NA 138  K 3.9  CL 109  CO2 20*  BUN 12  CREATININE 1.00  GLUCOSE 171*  CALCIUM 8.3*     Discharge Medications:  Allergies as of 06/03/2019      Reactions   Penicillins Anaphylaxis, Hives   Did it involve swelling of the face/tongue/throat, SOB, or low BP? Yes Did it involve sudden or severe rash/hives, skin peeling, or any reaction on the inside of your mouth or nose? No Did you need to seek medical attention at a hospital or doctor's office? Yes When did it last happen?Over 10 years ago If all above answers are "NO", may proceed with cephalosporin use.      Medication List    STOP taking these medications   acetaminophen 325 MG tablet Commonly known as: TYLENOL     TAKE these medications   albuterol 108 (90 Base) MCG/ACT inhaler Commonly known as: VENTOLIN HFA INHALE 2 PUFFS INTO THE LUNGS EVERY 6 HOURS AS NEEDED FOR WHEEZING OR SHORTNESS OF BREATH   aspirin EC 81 MG tablet Take 1 tablet (81 mg total) by mouth 2 (two) times daily. What changed: when to take this   atorvastatin 40 MG  tablet Commonly known as: LIPITOR Take 1 tablet (40 mg total) by mouth daily.   dipyridamole-aspirin 200-25 MG 12hr capsule Commonly known as: AGGRENOX TAKE 1 CAPSULE BY MOUTH DAILY   DULoxetine 30 MG capsule Commonly known as: CYMBALTA TAKE 1 CAPSULE(30 MG) BY MOUTH DAILY What changed:   how much to take  how to take this  when to take this  additional instructions   lisinopril 20 MG tablet Commonly known as: ZESTRIL Take 1 tablet (20 mg total) by mouth daily.   methocarbamol 750 MG tablet Commonly known as: ROBAXIN Take 1 tablet (750 mg total) by mouth 2 (two) times daily as needed for muscle spasms.   ondansetron 4 MG tablet Commonly known as: ZOFRAN Take 1-2 tablets (4-8 mg total) by mouth every 8 (eight) hours as needed for nausea or vomiting.   oxyCODONE 5 MG immediate release tablet Commonly known as: Oxy IR/ROXICODONE Take 1-2 tablets (5-10 mg total) by mouth every 8 (eight) hours as needed for severe pain.   oxyCODONE 10 mg 12 hr tablet Commonly known as: OXYCONTIN Take 1 tablet (10 mg total) by mouth every 12 (twelve) hours for 3 days.   promethazine 25 MG tablet Commonly known as: PHENERGAN Take 1 tablet (25 mg total) by mouth every 6 (six) hours as needed for nausea.   QUEtiapine 50 MG tablet Commonly known as: SEROQUEL Take 1 tablet (50 mg total) by mouth at bedtime.   senna-docusate 8.6-50 MG tablet Commonly known as: Senokot S Take 1-2 tablets by mouth at bedtime as needed.   vitamin B-12 1000 MCG tablet Commonly known as: CYANOCOBALAMIN Take 1,000 mcg by mouth every 7 (seven) days.   Vitamin D (Ergocalciferol) 1.25 MG (50000 UT) Caps capsule Commonly known as: DRISDOL TAKE 1 CAPSULE BY MOUTH 1 TIME A WEEK            Durable Medical Equipment  (From admission, onward)         Start     Ordered   06/02/19 1509  DME Walker rolling  Once    Question:  Patient needs a walker to treat with the following condition  Answer:  Total knee  replacement status   06/02/19 1509   06/02/19 1509  DME 3 n 1  Once     06/02/19 1509   06/02/19 1509  DME Bedside commode  Once    Question:  Patient needs a bedside commode to treat with the following condition  Answer:  Total knee replacement status   06/02/19 1509          Diagnostic Studies: Dg Knee Left Port  Result Date: 06/02/2019 CLINICAL DATA:  Postop left total knee arthroplasty. EXAM: PORTABLE LEFT KNEE - 1-2 VIEW COMPARISON:  Radiographs 05/01/2019. FINDINGS: Status post left total knee arthroplasty. The hardware is well positioned. No evidence of acute fracture or dislocation. A small amount of gas is present within the joint and surrounding soft tissues. Femoropopliteal atherosclerosis noted. IMPRESSION: No demonstrated complication following left total knee arthroplasty. Electronically Signed   By: Richardean Sale M.D.   On: 06/02/2019 14:24    Disposition:     Follow-up Information    Leandrew Koyanagi, MD In 2 weeks.   Specialty: Orthopedic Surgery Why: For suture removal, For wound re-check Contact information: [place] [id number] [phone]            Signed: Aundra Dubin 06/03/2019, 7:15 AM

## 2019-06-04 ENCOUNTER — Telehealth: Payer: Self-pay | Admitting: Orthopaedic Surgery

## 2019-06-04 ENCOUNTER — Telehealth: Payer: Self-pay

## 2019-06-04 NOTE — Telephone Encounter (Signed)
Please advise on meds.

## 2019-06-04 NOTE — Telephone Encounter (Signed)
FYI-   [facility] called and advised that they will be able to pickup the pt for services on 06/06/19. S/p left total knee

## 2019-06-04 NOTE — Telephone Encounter (Signed)
She has rx for oxy, robaxin, and senokot.  Does she need a diff rx or a prior auth?

## 2019-06-04 NOTE — Telephone Encounter (Signed)
Patient called requesting an RX for a stool softener and also for the Robaxin.  Patient stated that there needs to be a PA for the Oxycodone and the Robaxin.  CB#[phone].  Thank you.

## 2019-06-04 NOTE — Telephone Encounter (Signed)
I called, but no answer. I did not see prior auth paperwork for Oxycodone.

## 2019-06-05 NOTE — Telephone Encounter (Signed)
I tried to reach patient yesterday, but with no luck. Also, did not see any prior auth info on medication. Could you retry her today?

## 2019-06-06 ENCOUNTER — Other Ambulatory Visit: Payer: Self-pay

## 2019-06-06 ENCOUNTER — Telehealth: Payer: Self-pay | Admitting: Orthopaedic Surgery

## 2019-06-06 MED ORDER — CYCLOBENZAPRINE HCL 10 MG PO TABS: 10.0000 mg | ORAL_TABLET | Freq: Three times a day (TID) | ORAL | 0 refills | Status: DC | PRN

## 2019-06-06 NOTE — Telephone Encounter (Signed)
She can take OTC colace instead of the senokot.  5-10 mg flexeril TID prn #30

## 2019-06-06 NOTE — Telephone Encounter (Signed)
Patient aware of the below message, and that I called medication into pharmacy

## 2019-06-06 NOTE — Telephone Encounter (Signed)
Verdis Frederickson, PT working with patient, called. She would like verbal orders for PT 3x 1wk, 2x 1wk. Her call back number is [phone]

## 2019-06-06 NOTE — Telephone Encounter (Signed)
I called and she states she got Oxycodone. She states ins will not approve robaxin or senokot. Can you send in something else?

## 2019-06-06 NOTE — Telephone Encounter (Signed)
Verbal order given  

## 2019-06-17 ENCOUNTER — Encounter: Payer: Self-pay | Admitting: Orthopaedic Surgery

## 2019-06-17 ENCOUNTER — Ambulatory Visit (INDEPENDENT_AMBULATORY_CARE_PROVIDER_SITE_OTHER): Payer: 59 | Admitting: Orthopaedic Surgery

## 2019-06-17 VITALS — Ht 63.5 in | Wt 145.0 lb

## 2019-06-17 DIAGNOSIS — Z96652 Presence of left artificial knee joint: Secondary | ICD-10-CM | POA: Insufficient documentation

## 2019-06-17 MED ORDER — OXYCODONE-ACETAMINOPHEN 5-325 MG PO TABS: 1.0000 | ORAL_TABLET | Freq: Three times a day (TID) | ORAL | 0 refills | Status: DC | PRN

## 2019-06-17 MED ORDER — METHOCARBAMOL 750 MG PO TABS: 750.0000 mg | ORAL_TABLET | Freq: Two times a day (BID) | ORAL | 0 refills | Status: DC | PRN

## 2019-06-17 MED ORDER — NAPROXEN 500 MG PO TABS: 500.0000 mg | ORAL_TABLET | Freq: Two times a day (BID) | ORAL | 3 refills | Status: DC

## 2019-06-17 NOTE — Progress Notes (Signed)
Post-Op Visit Note   Patient: Judy Martinez           Date of Birth: [birth year]           MRN: [id number] Visit Date: 06/17/2019 PCP: Gildardo Pounds, NP   Assessment & Plan:  Chief Complaint:  Chief Complaint  Patient presents with  . Left Knee - Routine Post Op    06/02/2019 Left TKA   Visit Diagnoses:  1. Status post total knee replacement, left     Plan: Rodolfo is 2 weeks status post left total knee replacement.  She is doing well overall.  She is progressing slowly but steadily with home health PT.  She had a good report from them today.  Her surgical incision is clean dry and intact.  We remove the sutures in place Steri-Strips.  Her range of motion is essentially 0 to 90 degrees.  Denies any constitutional symptoms.  Medications were refilled today.  Naproxen prescribed for inflammation and swelling.  Continue with ice.  Prescription for outpatient PT.  Recheck in 4 weeks with two-view x-rays of the left knee.  Follow-Up Instructions: Return in about 4 weeks (around 07/15/2019).   Orders:  No orders of the defined types were placed in this encounter.  Meds ordered this encounter  Medications  . oxyCODONE-acetaminophen (PERCOCET) 5-325 MG tablet    Sig: Take 1 tablet by mouth every 8 (eight) hours as needed for severe pain.    Dispense:  30 tablet    Refill:  0  . methocarbamol (ROBAXIN) 750 MG tablet    Sig: Take 1 tablet (750 mg total) by mouth 2 (two) times daily as needed for muscle spasms.    Dispense:  60 tablet    Refill:  0  . naproxen (NAPROSYN) 500 MG tablet    Sig: Take 1 tablet (500 mg total) by mouth 2 (two) times daily with a meal.    Dispense:  30 tablet    Refill:  3    Imaging: No results found.  PMFS History: Patient Active Problem List   Diagnosis Date Noted  . Status post total knee replacement, left 06/17/2019  . Status post total left knee replacement 06/02/2019  . Primary osteoarthritis of left knee   . Elevated liver enzymes  09/25/2018  . Cocaine use 09/25/2018  . Smoker unmotivated to quit 09/25/2018  . Chronic bilateral low back pain with sciatica 08/28/2018  . Chronic pain of left knee 08/28/2018  . Alcohol abuse   . Chronic insomnia   . Hyperlipidemia   . Essential hypertension   . History of CVA (cerebrovascular accident)    Past Medical History:  Diagnosis Date  . Arthritis   . Heart attack ([place]) 2004  . Hypertension   . Stroke [facility]) 2015    Family History  Problem Relation Age of Onset  . Alcohol abuse Father     Past Surgical History:  Procedure Laterality Date  . ABDOMINAL HYSTERECTOMY    . TOTAL KNEE ARTHROPLASTY Left 06/02/2019  . TOTAL KNEE ARTHROPLASTY Left 06/02/2019   Procedure: LEFT TOTAL KNEE ARTHROPLASTY;  Surgeon: Leandrew Koyanagi, MD;  Location: [facility];  Service: Orthopedics;  Laterality: Left;  . TUBAL LIGATION     Social History   Occupational History  . Not on file  Tobacco Use  . Smoking status: Current Every Day Smoker    Packs/day: 0.50    Years: 15.00    Pack years: 7.50    Types: Cigarettes  .  Smokeless tobacco: Never Used  Substance and Sexual Activity  . Alcohol use: Yes    Comment: occ beer  . Drug use: Not Currently    Frequency: 2.0 times per week    Types: Cocaine, Marijuana    Comment: last used cocaine few days ago, marijuana last used 2-3 weeks ago  . Sexual activity: Yes    Birth control/protection: Surgical

## 2019-07-15 ENCOUNTER — Ambulatory Visit (INDEPENDENT_AMBULATORY_CARE_PROVIDER_SITE_OTHER): Payer: 59

## 2019-07-15 ENCOUNTER — Other Ambulatory Visit: Payer: Self-pay

## 2019-07-15 ENCOUNTER — Ambulatory Visit (INDEPENDENT_AMBULATORY_CARE_PROVIDER_SITE_OTHER): Payer: 59 | Admitting: Orthopaedic Surgery

## 2019-07-15 ENCOUNTER — Encounter: Payer: Self-pay | Admitting: Orthopaedic Surgery

## 2019-07-15 DIAGNOSIS — Z96652 Presence of left artificial knee joint: Secondary | ICD-10-CM | POA: Diagnosis not present

## 2019-07-15 MED ORDER — HYDROCODONE-ACETAMINOPHEN 5-325 MG PO TABS: 1.0000 | ORAL_TABLET | Freq: Every day | ORAL | 0 refills | Status: DC | PRN

## 2019-07-15 NOTE — Progress Notes (Signed)
Post-Op Visit Note    Patient: Judy Martinez           Date of Birth: [birth year]           MRN: [id number] Visit Date: 07/15/2019 PCP: Gildardo Pounds, NP   Assessment & Plan:  Chief Complaint:  Chief Complaint  Patient presents with  . Left Knee - Pain   Visit Diagnoses:  1. Status post total knee replacement, left     Plan: Judy Martinez is 6 weeks status post left total knee replacement.  She states that she has good and bad days.  Today she has 9 out of 10 pain.  She takes 1-2 oxycodones a day and is requesting a refill.  She is doing physical therapy twice a week.  She also takes Tylenol and naproxen.  Physical exam shows a fully healed surgical scar.  Range of motion is 10 to 95 degrees.  There is moderate guarding with increased flexion.  X-rays demonstrate stable left knee replacement.  Patient is overall improving.  She will continue with outpatient PT for strengthening and joint mobilization.  I have refilled her hydrocodone to help wean her off of the oxycodone.  Recheck in 6 weeks.  Dental prophylaxis reinforced today.  Follow-Up Instructions: Return in about 6 weeks (around 08/26/2019).   Orders:  Orders Placed This Encounter  Procedures  . XR KNEE 3 VIEW LEFT   Meds ordered this encounter  Medications  . HYDROcodone-acetaminophen (NORCO) 5-325 MG tablet    Sig: Take 1-2 tablets by mouth daily as needed.    Dispense:  30 tablet    Refill:  0    Imaging: Xr Knee 3 View Left  Result Date: 07/15/2019 Stable left total knee replacement   PMFS History: Patient Active Problem List   Diagnosis Date Noted  . Status post total knee replacement, left 06/17/2019  . Status post total left knee replacement 06/02/2019  . Primary osteoarthritis of left knee   . Elevated liver enzymes 09/25/2018  . Cocaine use 09/25/2018  . Smoker unmotivated to quit 09/25/2018  . Chronic bilateral low back pain with sciatica 08/28/2018  . Chronic pain of left knee 08/28/2018  .  Alcohol abuse   . Chronic insomnia   . Hyperlipidemia   . Essential hypertension   . History of CVA (cerebrovascular accident)    Past Medical History:  Diagnosis Date  . Arthritis   . Heart attack ([place]) 2004  . Hypertension   . Stroke [facility]) 2015    Family History  Problem Relation Age of Onset  . Alcohol abuse Father     Past Surgical History:  Procedure Laterality Date  . ABDOMINAL HYSTERECTOMY    . TOTAL KNEE ARTHROPLASTY Left 06/02/2019  . TOTAL KNEE ARTHROPLASTY Left 06/02/2019   Procedure: LEFT TOTAL KNEE ARTHROPLASTY;  Surgeon: Leandrew Koyanagi, MD;  Location: [place];  Service: Orthopedics;  Laterality: Left;  . TUBAL LIGATION     Social History   Occupational History  . Not on file  Tobacco Use  . Smoking status: Current Every Day Smoker    Packs/day: 0.50    Years: 15.00    Pack years: 7.50    Types: Cigarettes  . Smokeless tobacco: Never Used  Substance and Sexual Activity  . Alcohol use: Yes    Comment: occ beer  . Drug use: Not Currently    Frequency: 2.0 times per week    Types: Cocaine, Marijuana    Comment: last used cocaine few days  ago, marijuana last used 2-3 weeks ago  . Sexual activity: Yes    Birth control/protection: Surgical

## 2019-07-24 ENCOUNTER — Ambulatory Visit
Admission: RE | Admit: 2019-07-24 | Discharge: 2019-07-24 | Disposition: A | Discharge status: Home / Self Care | Payer: 59 | Source: Ambulatory Visit | Attending: Nurse Practitioner | Admitting: Nurse Practitioner

## 2019-07-24 ENCOUNTER — Other Ambulatory Visit: Payer: Self-pay

## 2019-07-24 DIAGNOSIS — Z13820 Encounter for screening for osteoporosis: Secondary | ICD-10-CM

## 2019-07-24 DIAGNOSIS — Z1231 Encounter for screening mammogram for malignant neoplasm of breast: Secondary | ICD-10-CM

## 2019-07-24 DIAGNOSIS — Z780 Asymptomatic menopausal state: Secondary | ICD-10-CM

## 2019-08-02 ENCOUNTER — Other Ambulatory Visit: Payer: Self-pay | Admitting: Nurse Practitioner

## 2019-08-02 DIAGNOSIS — I10 Essential (primary) hypertension: Secondary | ICD-10-CM

## 2019-08-02 DIAGNOSIS — E782 Mixed hyperlipidemia: Secondary | ICD-10-CM

## 2019-08-05 ENCOUNTER — Other Ambulatory Visit: Payer: Self-pay | Admitting: Family Medicine

## 2019-08-05 DIAGNOSIS — I10 Essential (primary) hypertension: Secondary | ICD-10-CM

## 2019-08-05 DIAGNOSIS — E782 Mixed hyperlipidemia: Secondary | ICD-10-CM

## 2019-08-26 ENCOUNTER — Ambulatory Visit (INDEPENDENT_AMBULATORY_CARE_PROVIDER_SITE_OTHER): Payer: 59 | Admitting: Physician Assistant

## 2019-08-26 ENCOUNTER — Other Ambulatory Visit: Payer: Self-pay

## 2019-08-26 ENCOUNTER — Encounter: Payer: Self-pay | Admitting: Orthopaedic Surgery

## 2019-08-26 VITALS — Ht 63.0 in | Wt 143.0 lb

## 2019-08-26 DIAGNOSIS — Z96652 Presence of left artificial knee joint: Secondary | ICD-10-CM

## 2019-08-26 MED ORDER — TRAMADOL HCL 50 MG PO TABS: 50.0000 mg | ORAL_TABLET | Freq: Four times a day (QID) | ORAL | 1 refills | Status: DC | PRN

## 2019-08-26 NOTE — Progress Notes (Signed)
Post-Op Visit Note   Patient: Judy Martinez           Date of Birth: [birth year]           MRN: [id number] Visit Date: 08/26/2019 PCP: Gildardo Pounds, NP   Assessment & Plan:  Chief Complaint:  Chief Complaint  Patient presents with  . Left Knee - Follow-up    Left TKA DOS 06/02/2019   Visit Diagnoses:  1. S/P total knee replacement, left     Plan: Patient is a pleasant 68 year old female who presents our clinic today 12 weeks status post left total knee replacement, date of surgery 06/02/2019.  She has been doing okay.  She still notes a constant ache to the left knee especially with rainfall and cold weather.  She has been in physical therapy where she is making good progress.  No fevers or chills.  Examination of the left knee reveals a fully healed surgical scar.  No complication.  Range of motion 0 to 110 degrees.  She is stable valgus varus stress.  She is neurovascularly intact distally.  At this point, she will continue with physical therapy.  I will call in a prescription for tramadol.  She will follow-up with [place] in 3 months time for repeat evaluation and 2 view x-rays of the left knee should she continue to be having pain.  Dental prophylaxis reinforced.  Call with concerns or questions.  Follow-Up Instructions: Return in about 3 months (around 11/24/2019).   Orders:  No orders of the defined types were placed in this encounter.  Meds ordered this encounter  Medications  . traMADol (ULTRAM) 50 MG tablet    Sig: Take 1 tablet (50 mg total) by mouth every 6 (six) hours as needed.    Dispense:  30 tablet    Refill:  1    Imaging: No new imaging  PMFS History: Patient Active Problem List   Diagnosis Date Noted  . Status post total knee replacement, left 06/17/2019  . Status post total left knee replacement 06/02/2019  . Primary osteoarthritis of left knee   . Elevated liver enzymes 09/25/2018  . Cocaine use 09/25/2018  . Smoker unmotivated to quit 09/25/2018   . Chronic bilateral low back pain with sciatica 08/28/2018  . Chronic pain of left knee 08/28/2018  . Alcohol abuse   . Chronic insomnia   . Hyperlipidemia   . Essential hypertension   . History of CVA (cerebrovascular accident)    Past Medical History:  Diagnosis Date  . Arthritis   . Heart attack ([place]) 2004  . Hypertension   . Stroke [facility]) 2015    Family History  Problem Relation Age of Onset  . Alcohol abuse Father     Past Surgical History:  Procedure Laterality Date  . ABDOMINAL HYSTERECTOMY    . TOTAL KNEE ARTHROPLASTY Left 06/02/2019  . TOTAL KNEE ARTHROPLASTY Left 06/02/2019   Procedure: LEFT TOTAL KNEE ARTHROPLASTY;  Surgeon: Leandrew Koyanagi, MD;  Location: [place];  Service: Orthopedics;  Laterality: Left;  . TUBAL LIGATION     Social History   Occupational History  . Not on file  Tobacco Use  . Smoking status: Current Every Day Smoker    Packs/day: 0.50    Years: 15.00    Pack years: 7.50    Types: Cigarettes  . Smokeless tobacco: Never Used  Substance and Sexual Activity  . Alcohol use: Yes    Comment: occ beer  . Drug use: Not Currently  Frequency: 2.0 times per week    Types: Cocaine, Marijuana    Comment: last used cocaine few days ago, marijuana last used 2-3 weeks ago  . Sexual activity: Yes    Birth control/protection: Surgical

## 2019-09-05 ENCOUNTER — Other Ambulatory Visit: Payer: Self-pay | Admitting: Family Medicine

## 2019-09-05 DIAGNOSIS — I10 Essential (primary) hypertension: Secondary | ICD-10-CM

## 2019-09-05 DIAGNOSIS — E782 Mixed hyperlipidemia: Secondary | ICD-10-CM

## 2019-09-06 ENCOUNTER — Other Ambulatory Visit: Payer: Self-pay | Admitting: Family Medicine

## 2019-09-08 ENCOUNTER — Other Ambulatory Visit: Payer: Self-pay

## 2019-09-08 ENCOUNTER — Ambulatory Visit: Payer: 59 | Attending: Nurse Practitioner | Admitting: Nurse Practitioner

## 2019-09-08 ENCOUNTER — Encounter: Payer: Self-pay | Admitting: Nurse Practitioner

## 2019-09-08 VITALS — BP 113/74 | HR 95 | Temp 97.9°F | Wt 138.0 lb

## 2019-09-08 DIAGNOSIS — M545 Low back pain, unspecified: Secondary | ICD-10-CM

## 2019-09-08 DIAGNOSIS — R7309 Other abnormal glucose: Secondary | ICD-10-CM

## 2019-09-08 DIAGNOSIS — E559 Vitamin D deficiency, unspecified: Secondary | ICD-10-CM

## 2019-09-08 DIAGNOSIS — Z0000 Encounter for general adult medical examination without abnormal findings: Secondary | ICD-10-CM

## 2019-09-08 DIAGNOSIS — I10 Essential (primary) hypertension: Secondary | ICD-10-CM

## 2019-09-08 DIAGNOSIS — E782 Mixed hyperlipidemia: Secondary | ICD-10-CM

## 2019-09-08 DIAGNOSIS — G8929 Other chronic pain: Secondary | ICD-10-CM

## 2019-09-08 DIAGNOSIS — F5101 Primary insomnia: Secondary | ICD-10-CM

## 2019-09-08 DIAGNOSIS — Z1159 Encounter for screening for other viral diseases: Secondary | ICD-10-CM

## 2019-09-08 DIAGNOSIS — Z1211 Encounter for screening for malignant neoplasm of colon: Secondary | ICD-10-CM

## 2019-09-08 LAB — POCT URINALYSIS DIP (CLINITEK)
Bilirubin, UA: NEGATIVE
Blood, UA: NEGATIVE
Glucose, UA: NEGATIVE mg/dL
Ketones, POC UA: NEGATIVE mg/dL
Leukocytes, UA: NEGATIVE
Nitrite, UA: NEGATIVE
POC PROTEIN,UA: NEGATIVE
Spec Grav, UA: 1.025 (ref 1.010–1.025)
Urobilinogen, UA: 0.2 E.U./dL
pH, UA: 5 (ref 5.0–8.0)

## 2019-09-08 MED ORDER — QUETIAPINE FUMARATE 50 MG PO TABS: 50.0000 mg | ORAL_TABLET | Freq: Every day | ORAL | 1 refills | Status: DC

## 2019-09-08 MED ORDER — ATORVASTATIN CALCIUM 40 MG PO TABS: 40.0000 mg | ORAL_TABLET | Freq: Every day | ORAL | 1 refills | Status: DC

## 2019-09-08 MED ORDER — ASPIRIN-DIPYRIDAMOLE ER 25-200 MG PO CP12: 1.0000 | ORAL_CAPSULE | Freq: Every day | ORAL | 0 refills | Status: DC

## 2019-09-08 MED ORDER — LISINOPRIL 20 MG PO TABS: 20.0000 mg | ORAL_TABLET | Freq: Every day | ORAL | 1 refills | Status: DC

## 2019-09-08 MED ORDER — CYCLOBENZAPRINE HCL 10 MG PO TABS: 10.0000 mg | ORAL_TABLET | Freq: Three times a day (TID) | ORAL | 1 refills | Status: DC | PRN

## 2019-09-08 NOTE — Progress Notes (Signed)
Assessment & Plan:  Judy Martinez was seen today for annual exam.  Diagnoses and all orders for this visit:  Encounter for annual physical exam -     CBC -     CMP14+EGFR -     Lipid Panel -     A1c  Colon cancer screening -     Ambulatory referral to Gastroenterology  Need for hepatitis C screening test -     Hepatitis C Antibody  Elevated glucose level -     A1c  Essential hypertension -     CMP14+EGFR  Mixed hyperlipidemia -     Lipid Panel    Patient has been counseled on age-appropriate routine health concerns for screening and prevention. These are reviewed and up-to-date. Referrals have been placed accordingly. Immunizations are up-to-date or declined.    Subjective:   Chief Complaint  Patient presents with  . Annual Exam   HPI Judy Martinez 68 y.o. female presents to office today for annual physical.   Review of Systems  Constitutional: Negative for fever, malaise/fatigue and weight loss.  HENT: Negative.  Negative for nosebleeds.   Eyes: Negative.  Negative for blurred vision, double vision and photophobia.  Respiratory: Negative.  Negative for cough and shortness of breath.   Cardiovascular: Negative.  Negative for chest pain, palpitations and leg swelling.  Gastrointestinal: Negative.  Negative for heartburn, nausea and vomiting.  Genitourinary: Positive for flank pain (left).  Musculoskeletal: Positive for joint pain (left knee). Negative for myalgias.  Skin: Negative.   Neurological: Negative.  Negative for dizziness, focal weakness, seizures and headaches.  Endo/Heme/Allergies: Negative.   Psychiatric/Behavioral: Negative.  Negative for suicidal ideas.    Past Medical History:  Diagnosis Date  . Arthritis   . Heart attack ([place]) 2004  . Hypertension   . Stroke [facility]) 2015    Past Surgical History:  Procedure Laterality Date  . ABDOMINAL HYSTERECTOMY    . TOTAL KNEE ARTHROPLASTY Left 06/02/2019  . TOTAL KNEE ARTHROPLASTY Left 06/02/2019     Procedure: LEFT TOTAL KNEE ARTHROPLASTY;  Surgeon: Leandrew Koyanagi, MD;  Location: [place];  Service: Orthopedics;  Laterality: Left;  . TUBAL LIGATION      Family History  Problem Relation Age of Onset  . Alcohol abuse Father     Social History Reviewed with no changes to be made today.   Outpatient Medications Prior to Visit  Medication Sig Dispense Refill  . albuterol (VENTOLIN HFA) 108 (90 Base) MCG/ACT inhaler INHALE 2 PUFFS INTO THE LUNGS EVERY 6 HOURS AS NEEDED FOR WHEEZING OR SHORTNESS OF BREATH 8.5 g 2  . aspirin EC 81 MG tablet Take 1 tablet (81 mg total) by mouth 2 (two) times daily. 84 tablet 0  . atorvastatin (LIPITOR) 40 MG tablet TAKE 1 TABLET(40 MG) BY MOUTH DAILY 30 tablet 0  . cyclobenzaprine (FLEXERIL) 10 MG tablet Take 1 tablet (10 mg total) by mouth 3 (three) times daily as needed for muscle spasms. 30 tablet 0  . dipyridamole-aspirin (AGGRENOX) 200-25 MG 12hr capsule TAKE 1 CAPSULE BY MOUTH DAILY 90 capsule 0  . DULoxetine (CYMBALTA) 30 MG capsule TAKE 1 CAPSULE(30 MG) BY MOUTH DAILY (Patient taking differently: Take 30 mg by mouth daily. ) 90 capsule 2  . lisinopril (ZESTRIL) 20 MG tablet TAKE 1 TABLET(20 MG) BY MOUTH DAILY 30 tablet 0  . methocarbamol (ROBAXIN) 750 MG tablet Take 1 tablet (750 mg total) by mouth 2 (two) times daily as needed for muscle spasms. 60 tablet 0  .  naproxen (NAPROSYN) 500 MG tablet Take 1 tablet (500 mg total) by mouth 2 (two) times daily with a meal. 30 tablet 3  . ondansetron (ZOFRAN) 4 MG tablet Take 1-2 tablets (4-8 mg total) by mouth every 8 (eight) hours as needed for nausea or vomiting. 40 tablet 0  . promethazine (PHENERGAN) 25 MG tablet Take 1 tablet (25 mg total) by mouth every 6 (six) hours as needed for nausea. 30 tablet 1  . QUEtiapine (SEROQUEL) 50 MG tablet Take 1 tablet (50 mg total) by mouth at bedtime. 90 tablet 0  . senna-docusate (SENOKOT S) 8.6-50 MG tablet Take 1-2 tablets by mouth at bedtime as needed. 30 tablet 1  .  Vitamin D, Ergocalciferol, (DRISDOL) 1.25 MG (50000 UT) CAPS capsule TAKE 1 CAPSULE BY MOUTH 1 TIME A WEEK 4 capsule 0  . HYDROcodone-acetaminophen (NORCO) 5-325 MG tablet Take 1-2 tablets by mouth daily as needed. (Patient not taking: Reported on 09/08/2019) 30 tablet 0  . methocarbamol (ROBAXIN) 750 MG tablet Take 1 tablet (750 mg total) by mouth 2 (two) times daily as needed for muscle spasms. (Patient not taking: Reported on 09/08/2019) 60 tablet 0  . oxyCODONE (OXY IR/ROXICODONE) 5 MG immediate release tablet Take 1-2 tablets (5-10 mg total) by mouth every 8 (eight) hours as needed for severe pain. (Patient not taking: Reported on 09/08/2019) 30 tablet 0  . oxyCODONE-acetaminophen (PERCOCET) 5-325 MG tablet Take 1 tablet by mouth every 8 (eight) hours as needed for severe pain. (Patient not taking: Reported on 09/08/2019) 30 tablet 0  . traMADol (ULTRAM) 50 MG tablet Take 1 tablet (50 mg total) by mouth every 6 (six) hours as needed. (Patient not taking: Reported on 09/08/2019) 30 tablet 1  . vitamin B-12 (CYANOCOBALAMIN) 1000 MCG tablet Take 1,000 mcg by mouth every 7 (seven) days.     No facility-administered medications prior to visit.    Allergies  Allergen Reactions  . Penicillins Anaphylaxis and Hives    Did it involve swelling of the face/tongue/throat, SOB, or low BP? Yes Did it involve sudden or severe rash/hives, skin peeling, or any reaction on the inside of your mouth or nose? No Did you need to seek medical attention at a hospital or doctor's office? Yes When did it last happen?Over 10 years ago If all above answers are "NO", may proceed with cephalosporin use.        Objective:    BP 113/74 (BP Location: Left Arm, Patient Position: Sitting, Cuff Size: Normal)   Pulse 95   Temp 97.9 F (36.6 C) (Oral)   Wt 138 lb (62.6 kg)   LMP  (LMP Unknown)   SpO2 99%   BMI 24.45 kg/m  Wt Readings from Last 3 Encounters:  09/08/19 138 lb (62.6 kg)  08/26/19 143 lb (64.9  kg)  06/17/19 145 lb (65.8 kg)    Physical Exam Constitutional:      Appearance: She is well-developed.  HENT:     Head: Normocephalic and atraumatic.     Right Ear: External ear normal.     Left Ear: External ear normal.     Nose: Nose normal.     Mouth/Throat:     Pharynx: No oropharyngeal exudate.  Eyes:     General: No scleral icterus.       Right eye: No discharge.     Conjunctiva/sclera: Conjunctivae normal.     Pupils: Pupils are equal, round, and reactive to light.  Neck:     Thyroid: No thyromegaly.  Trachea: No tracheal deviation.  Cardiovascular:     Rate and Rhythm: Normal rate and regular rhythm.     Pulses:          Dorsalis pedis pulses are 2+ on the right side and 2+ on the left side.       Posterior tibial pulses are 2+ on the right side and 2+ on the left side.     Heart sounds: Normal heart sounds. No murmur. No friction rub.  Pulmonary:     Effort: Pulmonary effort is normal. No accessory muscle usage or respiratory distress.     Breath sounds: Normal breath sounds. No decreased breath sounds, wheezing, rhonchi or rales.  Chest:     Chest wall: No tenderness.     Breasts: Breasts are symmetrical.        Right: No inverted nipple, mass, nipple discharge, skin change or tenderness.        Left: No inverted nipple, mass, nipple discharge, skin change or tenderness.  Abdominal:     General: Bowel sounds are normal. There is no distension.     Palpations: Abdomen is soft. There is no mass.     Tenderness: There is no abdominal tenderness. There is no right CVA tenderness, left CVA tenderness, guarding or rebound.  Genitourinary:    Comments: Deferred patient is status post hysterectomy Musculoskeletal:        General: No deformity. Normal range of motion.     Cervical back: Normal range of motion and neck supple.     Left knee: Swelling present. Tenderness present over the medial joint line, lateral joint line and patellar tendon.       Legs:      Comments: S/p left TKR  Lymphadenopathy:     Cervical: No cervical adenopathy.  Skin:    General: Skin is warm and dry.     Findings: No erythema.  Neurological:     Mental Status: She is alert and oriented to person, place, and time.     Cranial Nerves: No cranial nerve deficit.     Sensory: Sensation is intact.     Coordination: Coordination is intact. Coordination normal.     Deep Tendon Reflexes: Reflexes are normal and symmetric.     Reflex Scores:      Patellar reflexes are 2+ on the right side. Psychiatric:        Speech: Speech normal.        Behavior: Behavior normal.        Thought Content: Thought content normal.        Judgment: Judgment normal.          Patient has been counseled extensively about nutrition and exercise as well as the importance of adherence with medications and regular follow-up. The patient was given clear instructions to go to ER or return to medical center if symptoms don't improve, worsen or new problems develop. The patient verbalized understanding.   Follow-up: No follow-ups on file.   Gildardo Pounds, FNP-BC [facility] [place], [place]   09/08/2019, 10:52 AM

## 2019-09-09 ENCOUNTER — Encounter: Payer: Self-pay | Admitting: Gastroenterology

## 2019-09-09 ENCOUNTER — Other Ambulatory Visit: Payer: Self-pay | Admitting: Nurse Practitioner

## 2019-09-09 DIAGNOSIS — D649 Anemia, unspecified: Secondary | ICD-10-CM

## 2019-09-09 LAB — CMP14+EGFR
ALT: 30 IU/L (ref 0–32)
AST: 32 IU/L (ref 0–40)
Albumin/Globulin Ratio: 1.6 (ref 1.2–2.2)
Albumin: 4.5 g/dL (ref 3.8–4.8)
Alkaline Phosphatase: 100 IU/L (ref 39–117)
BUN/Creatinine Ratio: 21 (ref 12–28)
BUN: 22 mg/dL (ref 8–27)
Bilirubin Total: 0.5 mg/dL (ref 0.0–1.2)
CO2: 18 mmol/L — ABNORMAL LOW (ref 20–29)
Calcium: 10.1 mg/dL (ref 8.7–10.3)
Chloride: 102 mmol/L (ref 96–106)
Creatinine, Ser: 1.07 mg/dL — ABNORMAL HIGH (ref 0.57–1.00)
GFR calc Af Amer: 62 mL/min/{1.73_m2} (ref >59)
GFR calc non Af Amer: 54 mL/min/{1.73_m2} — ABNORMAL LOW (ref >59)
Globulin, Total: 2.8 g/dL (ref 1.5–4.5)
Glucose: 96 mg/dL (ref 65–99)
Potassium: 4.3 mmol/L (ref 3.5–5.2)
Sodium: 135 mmol/L (ref 134–144)
Total Protein: 7.3 g/dL (ref 6.0–8.5)

## 2019-09-09 LAB — CBC
Hematocrit: 36.2 % (ref 34.0–46.6)
Hemoglobin: 12 g/dL (ref 11.1–15.9)
MCH: 31.8 pg (ref 26.6–33.0)
MCHC: 33.1 g/dL (ref 31.5–35.7)
MCV: 96 fL (ref 79–97)
Platelets: 323 10*3/uL (ref 150–450)
RBC: 3.77 x10E6/uL (ref 3.77–5.28)
RDW: 14.5 % (ref 11.7–15.4)
WBC: 6 10*3/uL (ref 3.4–10.8)

## 2019-09-09 LAB — LIPID PANEL
Chol/HDL Ratio: 2 ratio (ref 0.0–4.4)
Cholesterol, Total: 195 mg/dL (ref 100–199)
HDL: 96 mg/dL (ref >39)
LDL Chol Calc (NIH): 68 mg/dL (ref 0–99)
Triglycerides: 197 mg/dL — ABNORMAL HIGH (ref 0–149)
VLDL Cholesterol Cal: 31 mg/dL (ref 5–40)

## 2019-09-09 LAB — HEMOGLOBIN A1C
Est. average glucose Bld gHb Est-mCnc: 126 mg/dL
Hgb A1c MFr Bld: 6 % — ABNORMAL HIGH (ref 4.8–5.6)

## 2019-09-09 LAB — VITAMIN D 25 HYDROXY (VIT D DEFICIENCY, FRACTURES): Vit D, 25-Hydroxy: 22.1 ng/mL — ABNORMAL LOW (ref 30.0–100.0)

## 2019-09-09 LAB — HEPATITIS C ANTIBODY: Hep C Virus Ab: 0.1 s/co ratio (ref 0.0–0.9)

## 2019-09-17 ENCOUNTER — Other Ambulatory Visit: Payer: Self-pay | Admitting: Nurse Practitioner

## 2019-09-17 MED ORDER — FERROUS SULFATE 325 (65 FE) MG PO TBEC: 325.0000 mg | DELAYED_RELEASE_TABLET | Freq: Every day | ORAL | 2 refills | Status: DC

## 2019-09-18 ENCOUNTER — Telehealth: Payer: Self-pay | Admitting: *Deleted

## 2019-09-18 NOTE — Telephone Encounter (Signed)
My understanding in that we stop Aggrenox 7 days prior to colonoscopy.

## 2019-09-18 NOTE — Telephone Encounter (Signed)
Dr Fuller Plan,  This pt is scheduled for a direct screening colon with you on 10-08-2019 WED. She hs never seen GI here. She is on Aggrenox daily due to a CVA in 2014- she has a hx of MI in 2004-  I wanted to make sure , per our information, that we do NOT have to stop Aggrenox for her colon.   Also wanted to make sure you didnt think she needed an OV prior to her colonoscopy.  Please advise, Lelan Pons

## 2019-09-22 NOTE — Telephone Encounter (Signed)
Yes, office visit prior to procedures. Thanks.

## 2019-09-22 NOTE — Telephone Encounter (Signed)
Dr Fuller Plan,  Do you want her to have an OV prior to her colon like with other blood thinners?  Please advise, Thanks,Marie

## 2019-09-22 NOTE — Telephone Encounter (Signed)
OV 1-19 at 2 pm with Janne Napoleon per Dr Juanda Bond- pt aware- RS OV with pt- colon 10-08-2019 with Dr Fuller Plan

## 2019-09-30 ENCOUNTER — Encounter: Payer: Self-pay | Admitting: Nurse Practitioner

## 2019-09-30 ENCOUNTER — Ambulatory Visit (INDEPENDENT_AMBULATORY_CARE_PROVIDER_SITE_OTHER): Payer: Medicare Other | Admitting: Nurse Practitioner

## 2019-09-30 ENCOUNTER — Telehealth: Payer: Self-pay

## 2019-09-30 ENCOUNTER — Telehealth: Payer: Self-pay | Admitting: Nurse Practitioner

## 2019-09-30 VITALS — BP 100/80 | HR 92 | Temp 97.2°F | Ht 63.5 in | Wt 139.4 lb

## 2019-09-30 DIAGNOSIS — Z1211 Encounter for screening for malignant neoplasm of colon: Secondary | ICD-10-CM | POA: Diagnosis not present

## 2019-09-30 DIAGNOSIS — Z7901 Long term (current) use of anticoagulants: Secondary | ICD-10-CM

## 2019-09-30 DIAGNOSIS — Z01818 Encounter for other preprocedural examination: Secondary | ICD-10-CM

## 2019-09-30 MED ORDER — NA SULFATE-K SULFATE-MG SULF 17.5-3.13-1.6 GM/177ML PO SOLN: ORAL | 0 refills | Status: DC

## 2019-09-30 NOTE — Progress Notes (Signed)
ASSESSMENT / PLAN:   69 year old female with pmh per chart significant for history of hypertension, cocaine use, tobacco use, history of CVA , hyperlipidemia  1.  Colon cancer screening.  No concerning signs or symptoms of colon cancer.  Mild chronic anemia with baseline hgb ~11. Hgb was down to 9.6 in September but that was following knee surgery.  -The risks and benefits of colonoscopy with possible polypectomy / biopsies were discussed and the patient agrees to proceed.   2. Chronic anti-platelet therapy, on Aggrenox + ASA (? Takes for secondary stroke prevention).  - Hold Aggrenox for 5 days before procedure - will instruct when and how to resume after procedure. Patient understands that there is a low but real risk of cardiovascular event such as heart attack, stroke, or embolism /  thrombosis, or ischemia while off Aggrenox. The patient consents to proceed. Will communicate by phone or EMR with patient's prescribing provider to confirm that holding Aggrenox is reasonable in this case.   HPI:    Referring provider:  [name], NP Reason for referral: Colon cancer screening.  On Aggrenox   Chief Complaint:   On Aggrenox, scheduled for colonoscopy   Patient is a 69 yo female with pmh as above. She is here for colon cancer screening.  Judy Martinez is already scheduled for colonoscopy but it was discovered that she takes Aggrenox which necessitated an office visit prior to the procedure.  Judy Martinez has no GI complaints such as abdominal pain, bowel changes nor blood in stool.  No [place] of colon cancer. She has no general medical complaints.  Patient says her Aggrenox was held for a week prior to knee surgery in September. Patient not sure why she takes Aggrenox and I couldn't really determine from chart but maybe for hx of CVA.    Past Medical History:  Diagnosis Date  . Arthritis   . Heart attack ([place]) 2004  . Hypertension   . Stroke [facility]) 2015     Past Surgical  History:  Procedure Laterality Date  . ABDOMINAL HYSTERECTOMY    . TOTAL KNEE ARTHROPLASTY Left 06/02/2019   Procedure: LEFT TOTAL KNEE ARTHROPLASTY;  Surgeon: Leandrew Koyanagi, MD;  Location: [place];  Service: Orthopedics;  Laterality: Left;  . TUBAL LIGATION     Family History  Problem Relation Age of Onset  . Alcohol abuse Father   . Esophageal cancer Neg Hx   . Colon cancer Neg Hx    Social History   Tobacco Use  . Smoking status: Current Every Day Smoker    Packs/day: 0.50    Years: 15.00    Pack years: 7.50    Types: Cigarettes  . Smokeless tobacco: Never Used  Substance Use Topics  . Alcohol use: Yes    Comment: occ beer  . Drug use: Yes    Frequency: 2.0 times per week    Types: Cocaine, Marijuana    Comment: last used cocaine few days ago, marijuana last used 2-3 weeks ago   Current Outpatient Medications  Medication Sig Dispense Refill  . albuterol (VENTOLIN HFA) 108 (90 Base) MCG/ACT inhaler INHALE 2 PUFFS INTO THE LUNGS EVERY 6 HOURS AS NEEDED FOR WHEEZING OR SHORTNESS OF BREATH 8.5 g 2  . aspirin EC 81 MG tablet Take 1 tablet (81 mg total) by mouth 2 (two) times daily. 84 tablet 0  . atorvastatin (LIPITOR) 40 MG tablet Take 1  tablet (40 mg total) by mouth daily at 6 PM. 90 tablet 1  . cyclobenzaprine (FLEXERIL) 10 MG tablet Take 1 tablet (10 mg total) by mouth 3 (three) times daily as needed for muscle spasms. 30 tablet 1  . dipyridamole-aspirin (AGGRENOX) 200-25 MG 12hr capsule Take 1 capsule by mouth daily. 90 capsule 0  . DULoxetine (CYMBALTA) 30 MG capsule TAKE 1 CAPSULE(30 MG) BY MOUTH DAILY (Patient taking differently: Take 30 mg by mouth daily. ) 90 capsule 2  . ferrous sulfate 325 (65 FE) MG EC tablet Take 1 tablet (325 mg total) by mouth daily with breakfast. 90 tablet 2  . HYDROcodone-acetaminophen (NORCO) 5-325 MG tablet Take 1-2 tablets by mouth daily as needed. 30 tablet 0  . lisinopril (ZESTRIL) 20 MG tablet Take 1 tablet (20 mg total) by mouth daily. 90  tablet 1  . methocarbamol (ROBAXIN) 750 MG tablet Take 750 mg by mouth daily.    . naproxen (NAPROSYN) 500 MG tablet Take 1 tablet (500 mg total) by mouth 2 (two) times daily with a meal. 30 tablet 3  . ondansetron (ZOFRAN) 4 MG tablet Take 1-2 tablets (4-8 mg total) by mouth every 8 (eight) hours as needed for nausea or vomiting. 40 tablet 0  . promethazine (PHENERGAN) 25 MG tablet Take 1 tablet (25 mg total) by mouth every 6 (six) hours as needed for nausea. 30 tablet 1  . QUEtiapine (SEROQUEL) 50 MG tablet Take 1 tablet (50 mg total) by mouth at bedtime. 90 tablet 1  . senna-docusate (SENOKOT S) 8.6-50 MG tablet Take 1-2 tablets by mouth at bedtime as needed. 30 tablet 1  . traMADol (ULTRAM) 50 MG tablet Take 1 tablet (50 mg total) by mouth every 6 (six) hours as needed. 30 tablet 1  . vitamin B-12 (CYANOCOBALAMIN) 1000 MCG tablet Take 1,000 mcg by mouth every 7 (seven) days.    . Vitamin D, Ergocalciferol, (DRISDOL) 1.25 MG (50000 UT) CAPS capsule TAKE 1 CAPSULE BY MOUTH 1 TIME A WEEK 4 capsule 0   No current facility-administered medications for this visit.   Allergies  Allergen Reactions  . Penicillins Anaphylaxis and Hives    Did it involve swelling of the face/tongue/throat, SOB, or low BP? Yes Did it involve sudden or severe rash/hives, skin peeling, or any reaction on the inside of your mouth or nose? No Did you need to seek medical attention at a hospital or doctor's office? Yes When did it last happen?Over 10 years ago If all above answers are "NO", may proceed with cephalosporin use.      Review of Systems: All systems reviewed and negative except where noted in HPI.      Physical Exam:    Wt Readings from Last 3 Encounters:  09/30/19 139 lb 6.4 oz (63.2 kg)  09/08/19 138 lb (62.6 kg)  08/26/19 143 lb (64.9 kg)    BP 100/80   Pulse 92   Temp (!) 97.2 F (36.2 C)   Ht 5' 3.5" (1.613 m)   Wt 139 lb 6.4 oz (63.2 kg)   LMP  (LMP Unknown)   BMI 24.31 kg/m    Constitutional:  Pleasant female in no acute distress. Psychiatric: Normal mood and affect. Behavior is normal. EENT: Pupils normal.  Conjunctivae are normal. No scleral icterus. Neck supple.  Cardiovascular: Normal rate, regular rhythm. No edema Pulmonary/chest: Effort normal and breath sounds normal. No wheezing, rales or rhonchi. Abdominal: Soft, nondistended, nontender. Bowel sounds active throughout. There are no masses palpable. No  hepatomegaly. Neurological: Alert and oriented to person place and time. Skin: Skin is warm and dry. No rashes noted.  Tye Savoy, NP  09/30/2019, 4:28 PM   Gildardo Pounds, NP

## 2019-09-30 NOTE — Telephone Encounter (Signed)
I am in agreeance with Dr. Fuller Plan in regard to 7 days.   Per GUIDELINES: Patients on aspirin for PRIMARY prevention, hold aspirin for five to seven days prior to selected high-risk procedures if aspirin is being given for primary prevention.

## 2019-09-30 NOTE — Patient Instructions (Addendum)
If you are age 68 or older, your body mass index should be between 23-30. Your Body mass index is 24.31 kg/m. If this is out of the aforementioned range listed, please consider follow up with your Primary Care Provider.  If you are age 107 or younger, your body mass index should be between 19-25. Your Body mass index is 24.31 kg/m. If this is out of the aformentioned range listed, please consider follow up with your Primary Care Provider.   You have been scheduled for a colonoscopy. Please follow written instructions given to you at your visit today.  Please pick up your prep supplies at the pharmacy within the next 1-3 days. If you use inhalers (even only as needed), please bring them with you on the day of your procedure. Your physician has requested that you go to www.startemmi.com and enter the access code given to you at your visit today. This web site gives a general overview about your procedure. However, you should still follow specific instructions given to you by our office regarding your preparation for the procedure.   We have sent the following medications to your pharmacy for you to pick up at your convenience: [place] will be contacted by our office prior to your procedure for directions on holding your Aggrenox.  If you do not hear from our office 1 week prior to your scheduled procedure, please call [phone] to discuss.   Thank you for choosing me and [facility].   Tye Savoy, NP

## 2019-09-30 NOTE — Telephone Encounter (Signed)
Peter Congo from [facility] would like a call back with an update on an authorization to stop -Ergocalciferol, (DRISDOL 7 days before his colonoscopy on 01/27/2. Please follow up as soon as possible

## 2019-10-01 NOTE — Telephone Encounter (Signed)
Will route to PCP 

## 2019-10-01 NOTE — Telephone Encounter (Signed)
Erroneous encounter

## 2019-10-01 NOTE — Telephone Encounter (Signed)
Patient notified by Jillyn Ledger, CMA to hold Aggrenox 7 days prior to colonoscopy per [name] NP.  Patient verbalized understanding.

## 2019-10-01 NOTE — Telephone Encounter (Signed)
CORRECT AGGRENOX/ASA/ANTIPLATELETS

## 2019-10-02 ENCOUNTER — Encounter: Payer: Self-pay | Admitting: Nurse Practitioner

## 2019-10-03 NOTE — Progress Notes (Signed)
Reviewed and agree with management plan.  Zamiah Tollett T. Ruthie Berch, MD FACG [facility]  

## 2019-10-06 ENCOUNTER — Ambulatory Visit (INDEPENDENT_AMBULATORY_CARE_PROVIDER_SITE_OTHER): Payer: Medicare Other

## 2019-10-06 DIAGNOSIS — Z1159 Encounter for screening for other viral diseases: Secondary | ICD-10-CM

## 2019-10-07 LAB — SARS CORONAVIRUS 2 (TAT 6-24 HRS): SARS Coronavirus 2: NEGATIVE

## 2019-10-08 ENCOUNTER — Ambulatory Visit (AMBULATORY_SURGERY_CENTER): Payer: Medicare Other | Admitting: Gastroenterology

## 2019-10-08 ENCOUNTER — Other Ambulatory Visit: Payer: Self-pay

## 2019-10-08 ENCOUNTER — Encounter: Payer: Self-pay | Admitting: Gastroenterology

## 2019-10-08 ENCOUNTER — Other Ambulatory Visit: Payer: Self-pay | Admitting: Gastroenterology

## 2019-10-08 VITALS — BP 124/60 | HR 97 | Temp 97.1°F | Resp 14 | Ht 63.5 in | Wt 139.0 lb

## 2019-10-08 DIAGNOSIS — D125 Benign neoplasm of sigmoid colon: Secondary | ICD-10-CM

## 2019-10-08 DIAGNOSIS — K635 Polyp of colon: Secondary | ICD-10-CM | POA: Diagnosis not present

## 2019-10-08 DIAGNOSIS — Z1211 Encounter for screening for malignant neoplasm of colon: Secondary | ICD-10-CM | POA: Diagnosis not present

## 2019-10-08 HISTORY — PX: COLONOSCOPY: SHX174

## 2019-10-08 MED ORDER — FLEET ENEMA 7-19 GM/118ML RE ENEM
1.0000 | ENEMA | Freq: Once | RECTAL | Status: AC
  Administered 2019-10-08: 13:00:00 1 via RECTAL

## 2019-10-08 MED ORDER — SODIUM CHLORIDE 0.9 % IV SOLN: 500.0000 mL | Freq: Once | INTRAVENOUS | Status: DC

## 2019-10-08 NOTE — Progress Notes (Signed)
Called to room to assist during endoscopic procedure.  Patient ID and intended procedure confirmed with present staff. Received instructions for my participation in the procedure from the performing physician.  

## 2019-10-08 NOTE — Progress Notes (Signed)
PT taken to PACU. Monitors in place. VSS. Report given to RN. 

## 2019-10-08 NOTE — Progress Notes (Signed)
Temp JB Vitals KA  1300---pt reports had sandwich yesterday at 10 am, call to Dr stark, pt reports yellow liquid bm with stool pieced, enema ordered, clear yellow liquid result

## 2019-10-08 NOTE — Patient Instructions (Signed)
Discharge instructions given. Handout on polyp. Resume previous medications. ]YOU HAD AN ENDOSCOPIC PROCEDURE TODAY AT THE [facility]:   Refer to the procedure report that was given to you for any specific questions about what was found during the examination.  If the procedure report does not answer your questions, please call your gastroenterologist to clarify.  If you requested that your care partner not be given the details of your procedure findings, then the procedure report has been included in a sealed envelope for you to review at your convenience later.  YOU SHOULD EXPECT: Some feelings of bloating in the abdomen. Passage of more gas than usual.  Walking can help get rid of the air that was put into your GI tract during the procedure and reduce the bloating. If you had a lower endoscopy (such as a colonoscopy or flexible sigmoidoscopy) you may notice spotting of blood in your stool or on the toilet paper. If you underwent a bowel prep for your procedure, you may not have a normal bowel movement for a few days.  Please Note:  You might notice some irritation and congestion in your nose or some drainage.  This is from the oxygen used during your procedure.  There is no need for concern and it should clear up in a day or so.  SYMPTOMS TO REPORT IMMEDIATELY:   Following lower endoscopy (colonoscopy or flexible sigmoidoscopy):  Excessive amounts of blood in the stool  Significant tenderness or worsening of abdominal pains  Swelling of the abdomen that is new, acute  Fever of 100F or higher   For urgent or emergent issues, a gastroenterologist can be reached at any hour by calling [phone].   DIET:  We do recommend a small meal at first, but then you may proceed to your regular diet.  Drink plenty of fluids but you should avoid alcoholic beverages for 24 hours.  ACTIVITY:  You should plan to take it easy for the rest of today and you should NOT DRIVE or use heavy  machinery until tomorrow (because of the sedation medicines used during the test).    FOLLOW UP: Our staff will call the number listed on your records 48-72 hours following your procedure to check on you and address any questions or concerns that you may have regarding the information given to you following your procedure. If we do not reach you, we will leave a message.  We will attempt to reach you two times.  During this call, we will ask if you have developed any symptoms of COVID 19. If you develop any symptoms (ie: fever, flu-like symptoms, shortness of breath, cough etc.) before then, please call [phone].  If you test positive for Covid 19 in the 2 weeks post procedure, please call and report this information to [place].    If any biopsies were taken you will be contacted by phone or by letter within the next 1-3 weeks.  Please call us at [phone] if you have not heard about the biopsies in 3 weeks.    SIGNATURES/CONFIDENTIALITY: You and/or your care partner have signed paperwork which will be entered into your electronic medical record.  These signatures attest to the fact that that the information above on your After Visit Summary has been reviewed and is understood.  Full responsibility of the confidentiality of this discharge information lies with you and/or your care-partner.

## 2019-10-08 NOTE — Op Note (Addendum)
[facility] Patient Name: Judy Martinez Procedure Date: 10/08/2019 1:06 PM MRN: [id number] Endoscopist: Ladene Artist , MD Age: 69 Referring MD:  Date of Birth: 21-Aug-1951 Gender: Female Account #: [id number] Procedure:                Colonoscopy Indications:              Screening for colorectal malignant neoplasm Medicines:                Monitored Anesthesia Care Procedure:                Pre-Anesthesia Assessment:                           - Prior to the procedure, a History and Physical                            was performed, and patient medications and                            allergies were reviewed. The patient's tolerance of                            previous anesthesia was also reviewed. The risks                            and benefits of the procedure and the sedation                            options and risks were discussed with the patient.                            All questions were answered, and informed consent                            was obtained. Prior Anticoagulants: The patient has                            taken Aggrenox, last dose was 5 days prior to                            procedure. ASA Grade Assessment: II - A patient                            with mild systemic disease. After reviewing the                            risks and benefits, the patient was deemed in                            satisfactory condition to undergo the procedure.                           After obtaining informed consent, the colonoscope  was passed under direct vision. Throughout the                            procedure, the patient's blood pressure, pulse, and                            oxygen saturations were monitored continuously. The                            Colonoscope was introduced through the anus and                            advanced to the the cecum, identified by                            appendiceal orifice and  ileocecal valve. The                            ileocecal valve, appendiceal orifice, and rectum                            were photographed. The quality of the bowel                            preparation was excellent. The colonoscopy was                            performed without difficulty. The patient tolerated                            the procedure well. Scope In: 1:42:13 PM Scope Out: 1:58:53 PM Scope Withdrawal Time: 0 hours 11 minutes 4 seconds  Total Procedure Duration: 0 hours 16 minutes 40 seconds  Findings:                 The perianal and digital rectal examinations were                            normal.                           A 8 mm polyp was found in the sigmoid colon. The                            polyp was sessile. The polyp was removed with a                            cold snare. Resection and retrieval were complete.                           The exam was otherwise without abnormality on                            direct and retroflexion views. Complications:  No immediate complications. Estimated blood loss:                            None. Estimated Blood Loss:     Estimated blood loss: none. Impression:               - One 8 mm polyp in the sigmoid colon, removed with                            a cold snare. Resected and retrieved.                           - The examination was otherwise normal on direct                            and retroflexion views. Recommendation:           - Repeat colonoscopy after studies are complete for                            surveillance / screening based on pathology results.                           - Patient has a contact number available for                            emergencies. The signs and symptoms of potential                            delayed complications were discussed with the                            patient. Return to normal activities tomorrow.                            Written discharge  instructions were provided to the                            patient.                           - Resume previous diet.                           - Continue present medications.                           - Await pathology results.                           - Resume Aggrenox at prior dose in 2 days. Refer to                            managing physician for further adjustment of  therapy. Ladene Artist, MD 10/08/2019 2:04:27 PM This report has been signed electronically.

## 2019-10-10 ENCOUNTER — Telehealth: Payer: Self-pay

## 2019-10-10 NOTE — Telephone Encounter (Signed)
  Follow up Call-  Call back number 10/08/2019  Post procedure Call Back phone  # [phone]  Permission to leave phone message Yes     Patient questions:  Do you have a fever, pain , or abdominal swelling? No. Pain Score  0 *  Have you tolerated food without any problems? Yes.    Have you been able to return to your normal activities? Yes.    Do you have any questions about your discharge instructions: Diet   No. Medications  No. Follow up visit  No.  Do you have questions or concerns about your Care? No.  Actions: * If pain score is 4 or above: No action needed, pain <4. 1. Have you developed a fever since your procedure? no  2.   Have you had an respiratory symptoms (SOB or cough) since your procedure? no  3.   Have you tested positive for COVID 19 since your procedure no  4.   Have you had any family members/close contacts diagnosed with the COVID 19 since your procedure?  no   If yes to any of these questions please route to Joylene John, RN and Alphonsa Gin, Therapist, sports.

## 2019-10-10 NOTE — Telephone Encounter (Signed)
First attempt follow up call to pt, lm on vm 

## 2019-10-13 ENCOUNTER — Encounter: Payer: Self-pay | Admitting: Gastroenterology

## 2019-11-05 ENCOUNTER — Other Ambulatory Visit: Payer: Self-pay | Admitting: Orthopaedic Surgery

## 2019-11-05 ENCOUNTER — Other Ambulatory Visit: Payer: Self-pay | Admitting: Nurse Practitioner

## 2019-11-05 ENCOUNTER — Other Ambulatory Visit: Payer: Self-pay | Admitting: Family Medicine

## 2019-11-05 DIAGNOSIS — E782 Mixed hyperlipidemia: Secondary | ICD-10-CM

## 2019-11-05 DIAGNOSIS — I10 Essential (primary) hypertension: Secondary | ICD-10-CM

## 2019-11-25 ENCOUNTER — Ambulatory Visit (INDEPENDENT_AMBULATORY_CARE_PROVIDER_SITE_OTHER): Payer: Medicare Other | Admitting: Orthopaedic Surgery

## 2019-11-25 ENCOUNTER — Other Ambulatory Visit: Payer: Self-pay

## 2019-11-25 ENCOUNTER — Ambulatory Visit: Payer: Self-pay

## 2019-11-25 ENCOUNTER — Encounter: Payer: Self-pay | Admitting: Orthopaedic Surgery

## 2019-11-25 DIAGNOSIS — Z96652 Presence of left artificial knee joint: Secondary | ICD-10-CM

## 2019-11-25 NOTE — Progress Notes (Signed)
Office Visit Note   Patient: Judy Martinez           Date of Birth: [birth year]           MRN: [id number] Visit Date: 11/25/2019              Requested by: Gildardo Pounds, NP [place],  [place] [address] PCP: Gildardo Pounds, NP   Assessment & Plan: Visit Diagnoses:  1. S/P total knee replacement, left     Plan: [name] is doing well for her 75-month checkup.  Activities and dental prophylaxis were reviewed today.  We will see her again in 6 months for her 1 year visit with 2 view x-rays of the left knee.  Follow-Up Instructions: Return in about 6 months (around 05/27/2020).   Orders:  Orders Placed This Encounter  Procedures  . XR KNEE 3 VIEW LEFT   No orders of the defined types were placed in this encounter.     Procedures: No procedures performed   Clinical Data: No additional findings.   Subjective: Chief Complaint  Patient presents with  . Left Knee - Pain    Judy Martinez is approximately 6 months status post left total knee replacement.  She is doing well has very mild pain at times.  Overall she is happy with her outcome.   Review of Systems   Objective: Vital Signs: LMP  (LMP Unknown)   Physical Exam  Ortho Exam Left knee exam shows a fully healed surgical scar.  She has excellent range of motion.  Collaterals are stable. Specialty Comments:  No specialty comments available.  Imaging: XR KNEE 3 VIEW LEFT  Result Date: 11/25/2019 Stable total knee replacement without complication    PMFS History: Patient Active Problem List   Diagnosis Date Noted  . Status post total knee replacement, left 06/17/2019  . Status post total left knee replacement 06/02/2019  . Primary osteoarthritis of left knee   . Elevated liver enzymes 09/25/2018  . Cocaine use 09/25/2018  . Smoker unmotivated to quit 09/25/2018  . Chronic bilateral low back pain with sciatica 08/28/2018  . Chronic pain of left knee 08/28/2018  . Alcohol abuse   .  Chronic insomnia   . Hyperlipidemia   . Essential hypertension   . History of CVA (cerebrovascular accident)    Past Medical History:  Diagnosis Date  . Arthritis   . Heart attack ([place]) 2004  . Hypertension   . Stroke [facility]) 2015    Family History  Problem Relation Age of Onset  . Alcohol abuse Father   . Esophageal cancer Neg Hx   . Colon cancer Neg Hx   . Colon polyps Neg Hx   . Rectal cancer Neg Hx   . Stomach cancer Neg Hx     Past Surgical History:  Procedure Laterality Date  . ABDOMINAL HYSTERECTOMY    . COLONOSCOPY  10/08/2019   previous in [place]  . TOTAL KNEE ARTHROPLASTY Left 06/02/2019   Procedure: LEFT TOTAL KNEE ARTHROPLASTY;  Surgeon: Leandrew Koyanagi, MD;  Location: [facility];  Service: Orthopedics;  Laterality: Left;  . TUBAL LIGATION     Social History   Occupational History  . Not on file  Tobacco Use  . Smoking status: Current Every Day Smoker    Packs/day: 0.50    Years: 15.00    Pack years: 7.50    Types: Cigarettes  . Smokeless tobacco: Never Used  Substance and Sexual Activity  . Alcohol  use: Yes    Comment: occ beer  . Drug use: Yes    Frequency: 2.0 times per week    Types: Cocaine, Marijuana    Comment: last used cocaine few days ago, marijuana last used 2-3 weeks ago  . Sexual activity: Yes    Birth control/protection: Surgical

## 2020-03-03 ENCOUNTER — Other Ambulatory Visit: Payer: Self-pay | Admitting: Nurse Practitioner

## 2020-03-03 DIAGNOSIS — F5101 Primary insomnia: Secondary | ICD-10-CM

## 2020-04-02 ENCOUNTER — Other Ambulatory Visit: Payer: Self-pay | Admitting: Family Medicine

## 2020-04-02 DIAGNOSIS — F5101 Primary insomnia: Secondary | ICD-10-CM

## 2020-04-02 NOTE — Telephone Encounter (Signed)
Requested medication (s) are due for refill today: Provider to decide  Requested medication (s) are on the active medication list:   Yes  Future visit scheduled:   No   Last ordered: A month ago  Returned because this is a non delegated refill   Requested Prescriptions  Pending Prescriptions Disp Refills   QUEtiapine (SEROQUEL) 50 MG tablet [Pharmacy Med Name: QUETIAPINE 50MG   TABLETS] 30 tablet 0    Sig: TAKE 1 TABLET(50 MG) BY MOUTH AT BEDTIME      Not Delegated - Psychiatry:  Antipsychotics - Second Generation (Atypical) - quetiapine Failed - 04/02/2020  8:47 AM      Failed - This refill cannot be delegated      Failed - ALT in normal range and within 180 days    ALT  Date Value Ref Range Status  09/08/2019 30 0 - 32 IU/L Final          Failed - AST in normal range and within 180 days    AST  Date Value Ref Range Status  09/08/2019 32 0 - 40 IU/L Final          Failed - Valid encounter within last 6 months    Recent Outpatient Visits           6 months ago Encounter for annual physical exam   [place], Vernia Buff, NP   11 months ago Essential hypertension   [place], Vernia Buff, NP   1 year ago Essential hypertension   [place], Vernia Buff, NP   1 year ago Essential hypertension   [place], Vernia Buff, NP       Future Appointments             In 1 month Leandrew Koyanagi, MD [place] - Last BP in normal range    BP Readings from Last 1 Encounters:  10/08/19 124/60

## 2020-05-14 ENCOUNTER — Ambulatory Visit: Payer: Medicare Other | Admitting: Nurse Practitioner

## 2020-05-27 ENCOUNTER — Ambulatory Visit (INDEPENDENT_AMBULATORY_CARE_PROVIDER_SITE_OTHER): Payer: Medicare Other | Admitting: Orthopaedic Surgery

## 2020-05-27 ENCOUNTER — Ambulatory Visit: Payer: Self-pay

## 2020-05-27 ENCOUNTER — Encounter: Payer: Self-pay | Admitting: Orthopaedic Surgery

## 2020-05-27 VITALS — Ht 63.0 in | Wt 134.0 lb

## 2020-05-27 DIAGNOSIS — Z96652 Presence of left artificial knee joint: Secondary | ICD-10-CM

## 2020-05-27 MED ORDER — TRAMADOL HCL 50 MG PO TABS: 50.0000 mg | ORAL_TABLET | Freq: Two times a day (BID) | ORAL | 0 refills | Status: DC | PRN

## 2020-05-27 NOTE — Progress Notes (Signed)
Office Visit Note   Patient: Judy Martinez Below           Date of Birth: [birth year]           MRN: [id number] Visit Date: 05/27/2020              Requested by: Gildardo Pounds, NP [place],  [place] [address] PCP: Gildardo Pounds, NP   Assessment & Plan: Visit Diagnoses:  1. S/P total knee replacement, left   2. Hx of total knee replacement, left     Plan: Impression is 1 year status post left total knee replacement.  I believe the patient's continued symptoms are coming from her quadricep weakness.  We will start her in physical therapy and have called in a prescription for tramadol.  She will follow up with [place] as in 1 year for repeat evaluation and 2 view x-rays of the left knee.  Dental prophylaxis reinforced.  Follow-Up Instructions: Return in about 1 year (around 05/27/2021).   Orders:  Orders Placed This Encounter  Procedures  . XR Knee 1-2 Views Left  . Ambulatory referral to Physical Therapy   Meds ordered this encounter  Medications  . traMADol (ULTRAM) 50 MG tablet    Sig: Take 1 tablet (50 mg total) by mouth 2 (two) times daily as needed.    Dispense:  30 tablet    Refill:  0      Procedures: No procedures performed   Clinical Data: No additional findings.   Subjective: Chief Complaint  Patient presents with  . Left Knee - Follow-up    Left total knee arthroplasty 06/02/2019    HPI patient is a pleasant 69 year old female who comes in today 1 year out left total knee replacement 06/02/2019.  She continues to complain of constant soreness to the entire knee.  She also has associated swelling at times.  She has been taking Tylenol without relief of symptoms.  She denies any new injury or change in activity.  Review of Systems as detailed in HPI.  All other review and are negative   Objective: Vital Signs: Ht 5\' 3"  (1.6 m)   Wt 134 lb (60.8 kg)   LMP  (LMP Unknown)   BMI 23.74 kg/m   Physical Exam.  Well-developed well-nourished  female no acute distress.  Alert and oriented x3.  Ortho Exam left knee exam shows a very small effusion.  Range of motion 0 to 110 degrees.  Lateral joint line tenderness.  Ligaments are stable.  She is neurovascularly intact distally.  Specialty Comments:  No specialty comments available.  Imaging: XR Knee 1-2 Views Left  Result Date: 05/27/2020 X-rays demonstrate stable alignment of the prosthesis without complication    PMFS History: Patient Active Problem List   Diagnosis Date Noted  . Status post total knee replacement, left 06/17/2019  . Status post total left knee replacement 06/02/2019  . Primary osteoarthritis of left knee   . Elevated liver enzymes 09/25/2018  . Cocaine use 09/25/2018  . Smoker unmotivated to quit 09/25/2018  . Chronic bilateral low back pain with sciatica 08/28/2018  . Chronic pain of left knee 08/28/2018  . Alcohol abuse   . Chronic insomnia   . Hyperlipidemia   . Essential hypertension   . History of CVA (cerebrovascular accident)    Past Medical History:  Diagnosis Date  . Arthritis   . Heart attack ([place]) 2004  . Hypertension   . Stroke [facility]) 2015  Family History  Problem Relation Age of Onset  . Alcohol abuse Father   . Esophageal cancer Neg Hx   . Colon cancer Neg Hx   . Colon polyps Neg Hx   . Rectal cancer Neg Hx   . Stomach cancer Neg Hx     Past Surgical History:  Procedure Laterality Date  . ABDOMINAL HYSTERECTOMY    . COLONOSCOPY  10/08/2019   previous in [place]  . TOTAL KNEE ARTHROPLASTY Left 06/02/2019   Procedure: LEFT TOTAL KNEE ARTHROPLASTY;  Surgeon: Leandrew Koyanagi, MD;  Location: [facility];  Service: Orthopedics;  Laterality: Left;  . TUBAL LIGATION     Social History   Occupational History  . Not on file  Tobacco Use  . Smoking status: Current Every Day Smoker    Packs/day: 0.50    Years: 15.00    Pack years: 7.50    Types: Cigarettes  . Smokeless tobacco: Never Used  Vaping Use  . Vaping Use: Never used    Substance and Sexual Activity  . Alcohol use: Yes    Comment: occ beer  . Drug use: Yes    Frequency: 2.0 times per week    Types: Cocaine, Marijuana    Comment: last used cocaine few days ago, marijuana last used 2-3 weeks ago  . Sexual activity: Yes    Birth control/protection: Surgical

## 2020-06-01 ENCOUNTER — Other Ambulatory Visit: Payer: Self-pay | Admitting: Nurse Practitioner

## 2020-06-01 ENCOUNTER — Other Ambulatory Visit: Payer: Self-pay | Admitting: Family Medicine

## 2020-06-01 DIAGNOSIS — I10 Essential (primary) hypertension: Secondary | ICD-10-CM

## 2020-06-01 DIAGNOSIS — E782 Mixed hyperlipidemia: Secondary | ICD-10-CM

## 2020-06-01 DIAGNOSIS — G8929 Other chronic pain: Secondary | ICD-10-CM

## 2020-06-01 NOTE — Telephone Encounter (Signed)
Requested Prescriptions  Pending Prescriptions Disp Refills  . lisinopril (ZESTRIL) 20 MG tablet [Pharmacy Med Name: LISINOPRIL 20MG  TABLETS] 90 tablet 0    Sig: TAKE 1 TABLET(20 MG) BY MOUTH DAILY     Cardiovascular:  ACE Inhibitors Failed - 06/01/2020  6:23 AM      Failed - Cr in normal range and within 180 days    Creatinine, Ser  Date Value Ref Range Status  09/08/2019 1.07 (H) 0.57 - 1.00 mg/dL Final         Failed - K in normal range and within 180 days    Potassium  Date Value Ref Range Status  09/08/2019 4.3 3.5 - 5.2 mmol/L Final         Failed - Valid encounter within last 6 months    Recent Outpatient Visits          8 months ago Encounter for annual physical exam   [place], Vernia Buff, NP   1 year ago Essential hypertension   [place], Vernia Buff, NP   1 year ago Essential hypertension   [place], Vernia Buff, NP   1 year ago Essential hypertension   [place], Zelda W, NP      Future Appointments            In 3 weeks Gildardo Pounds, NP [place] - Patient is not pregnant      Passed - Last BP in normal range    BP Readings from Last 1 Encounters:  10/08/19 124/60         . atorvastatin (LIPITOR) 40 MG tablet [Pharmacy Med Name: ATORVASTATIN 40MG  TABLETS] 90 tablet 0    Sig: TAKE 1 TABLET(40 MG) BY MOUTH DAILY     Cardiovascular:  Antilipid - Statins Failed - 06/01/2020  6:23 AM      Failed - LDL in normal range and within 360 days    LDL Chol Calc (NIH)  Date Value Ref Range Status  09/08/2019 68 0 - 99 mg/dL Final         Failed - Triglycerides in normal range and within 360 days    Triglycerides  Date Value Ref Range Status  09/08/2019 197 (H) 0 - 149 mg/dL Final         Passed - Total Cholesterol in normal range and within 360 days     Cholesterol, Total  Date Value Ref Range Status  09/08/2019 195 100 - 199 mg/dL Final         Passed - HDL in normal range and within 360 days    HDL  Date Value Ref Range Status  09/08/2019 96 >39 mg/dL Final         Passed - Patient is not pregnant      Passed - Valid encounter within last 12 months    Recent Outpatient Visits          8 months ago Encounter for annual physical exam   [place], Vernia Buff, NP   1 year ago Essential hypertension   [place], Vernia Buff, NP   1 year ago Essential hypertension   [place], Vernia Buff, NP   1 year ago Essential hypertension  [place] Gildardo Pounds, NP      Future Appointments            In 3 weeks Gildardo Pounds, NP [place]

## 2020-06-01 NOTE — Telephone Encounter (Signed)
Requested Prescriptions  Pending Prescriptions Disp Refills  . DULoxetine (CYMBALTA) 30 MG capsule [Pharmacy Med Name: DULOXETINE DR 30MG  CAPSULES] 90 capsule 2    Sig: TAKE 1 CAPSULE(30 MG) BY MOUTH DAILY     Psychiatry: Antidepressants - SNRI Failed - 06/01/2020  6:22 AM      Failed - Valid encounter within last 6 months    Recent Outpatient Visits          8 months ago Encounter for annual physical exam   [place], Vernia Buff, NP   1 year ago Essential hypertension   [place], Vernia Buff, NP   1 year ago Essential hypertension   [place], Vernia Buff, NP   1 year ago Essential hypertension   [place], Zelda W, NP      Future Appointments            In 3 weeks Gildardo Pounds, NP [place] BP in normal range    BP Readings from Last 1 Encounters:  10/08/19 124/60         Courtesy Refill provided per protocol guidelines. Patient must keep upcoming appointment for future refills.

## 2020-06-01 NOTE — Telephone Encounter (Signed)
Requested medications are due for refill today?  Yes  Requested medications are on active medication list?  Yes  Last Refill:  11/06/2019  # 90 with no refills  Future visit scheduled? Yes  Notes to Clinic:   Medication failed Rx refill protocol due to no valid encounter in the past 6 months and no labs within the past 6 months.   Last office visit was 8 months ago.  Last labs were performed 09/08/2019.

## 2020-06-15 ENCOUNTER — Other Ambulatory Visit: Payer: Self-pay | Admitting: Family Medicine

## 2020-06-15 DIAGNOSIS — I10 Essential (primary) hypertension: Secondary | ICD-10-CM

## 2020-06-22 ENCOUNTER — Ambulatory Visit: Payer: Medicare Other | Admitting: Nurse Practitioner

## 2020-06-28 ENCOUNTER — Ambulatory Visit: Payer: Medicare HMO | Attending: Physician Assistant | Admitting: Physical Therapy

## 2020-07-16 ENCOUNTER — Other Ambulatory Visit: Payer: Self-pay | Admitting: Nurse Practitioner

## 2020-07-16 DIAGNOSIS — G8929 Other chronic pain: Secondary | ICD-10-CM

## 2020-07-16 DIAGNOSIS — M25562 Pain in left knee: Secondary | ICD-10-CM

## 2020-07-16 DIAGNOSIS — E782 Mixed hyperlipidemia: Secondary | ICD-10-CM

## 2020-08-01 ENCOUNTER — Other Ambulatory Visit: Payer: Self-pay | Admitting: Nurse Practitioner

## 2020-08-01 DIAGNOSIS — G8929 Other chronic pain: Secondary | ICD-10-CM

## 2020-08-01 DIAGNOSIS — M25562 Pain in left knee: Secondary | ICD-10-CM

## 2020-08-02 NOTE — Telephone Encounter (Signed)
Requested Prescriptions  Pending Prescriptions Disp Refills  . DULoxetine (CYMBALTA) 30 MG capsule [Pharmacy Med Name: DULoxetine HCl 30 MG Oral Capsule Delayed Release Particles] 30 capsule 0    Sig: TAKE 1 CAPSULE BY MOUTH  DAILY     Psychiatry: Antidepressants - SNRI Failed - 08/01/2020 11:09 PM      Failed - Valid encounter within last 6 months    Recent Outpatient Visits          10 months ago Encounter for annual physical exam   [place], Vernia Buff, NP   1 year ago Essential hypertension   [place], Vernia Buff, NP   1 year ago Essential hypertension   [place], Vernia Buff, NP   1 year ago Essential hypertension   [place], [place] W, NP             Passed - Last BP in normal range    BP Readings from Last 1 Encounters:  10/08/19 124/60          Attempted to call pt.  Left vm to call office to schedule annual appt. Courtesy refill given; #30; no refills at this time.

## 2020-09-15 IMAGING — CR DG KNEE 1-2V*L*
2 series · 2 of 2 positions shown · non-contrast
Comparison: None.

CLINICAL DATA: Left-sided knee pain

EXAM:
LEFT KNEE - 1-2 VIEW

[t knee ap left]
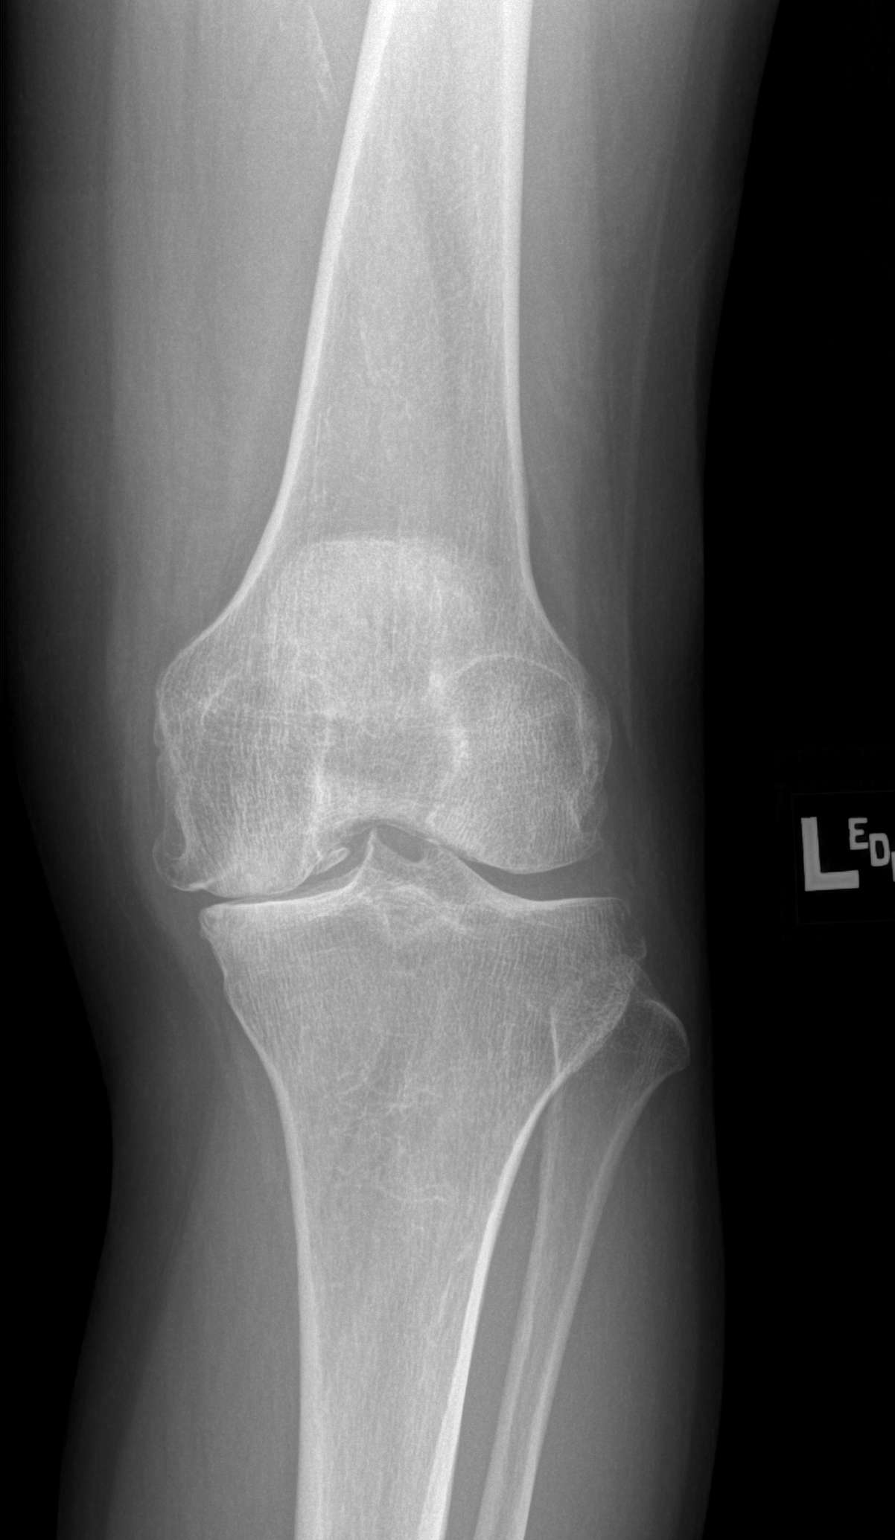

[t knee lat left]
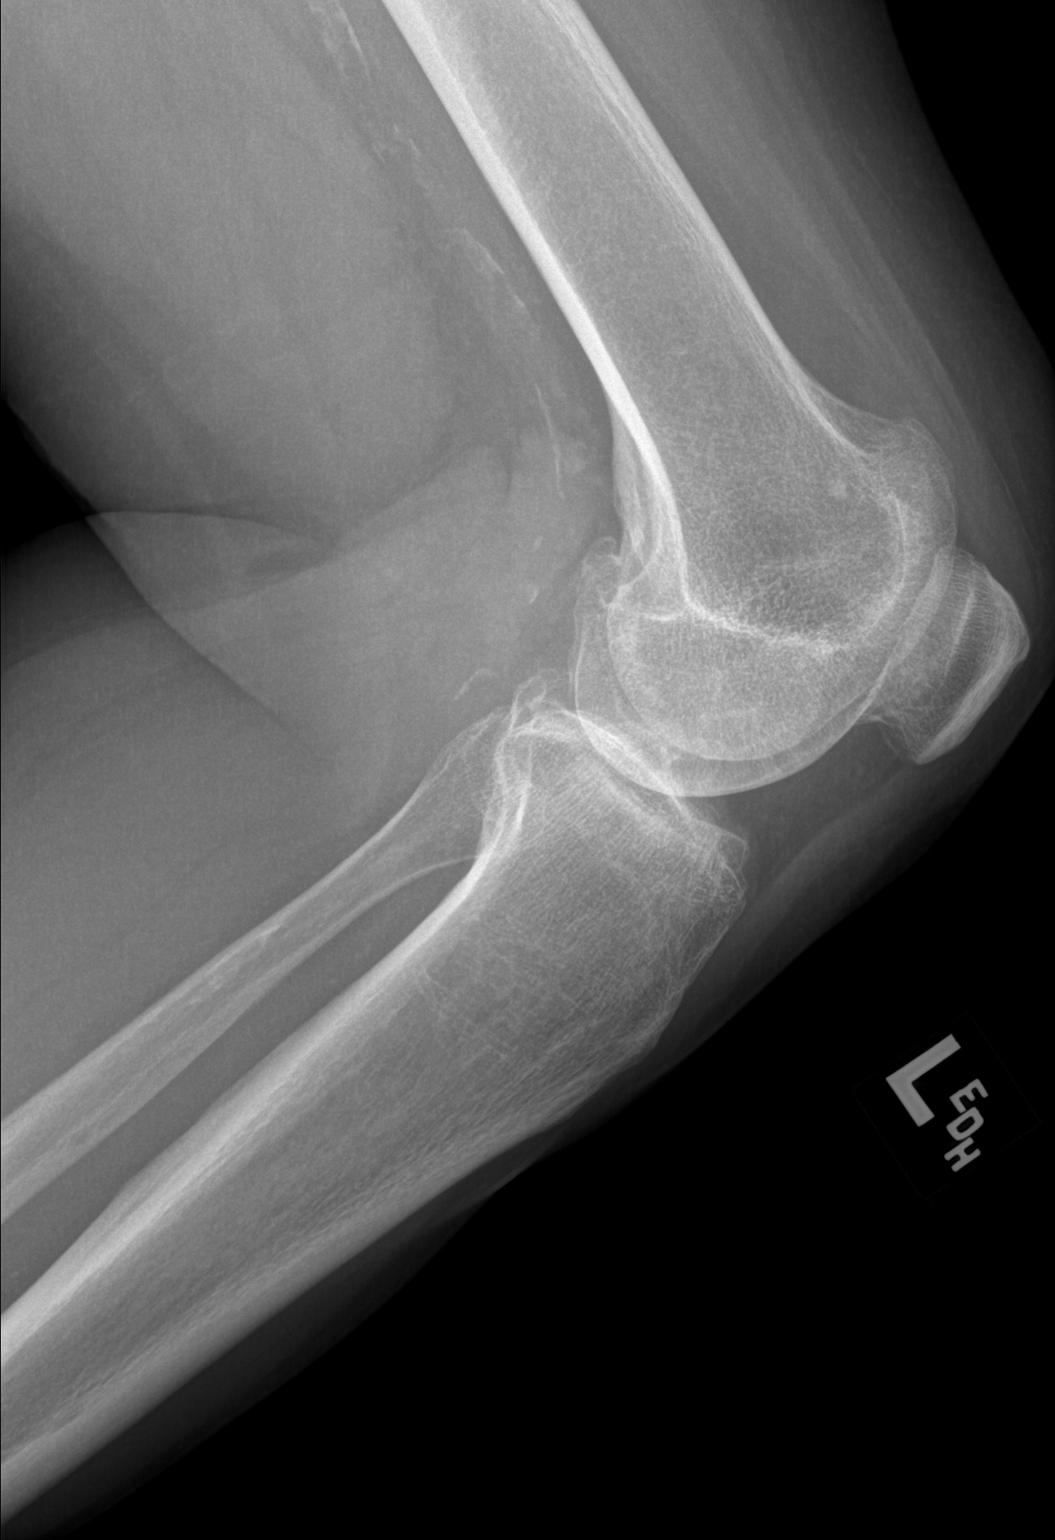

[2 of 2 positions shown; findings below may reference images not displayed]

FINDINGS: No fracture or malalignment. Moderate-to-marked arthritis medial
compartment of the knee. Moderate patellofemoral degenerative
change. No knee effusion. Vascular calcification.
IMPRESSION: Arthritis involving the knee, most marked involving the medial joint
space.

## 2020-10-04 ENCOUNTER — Other Ambulatory Visit: Payer: Self-pay | Admitting: Family Medicine

## 2020-10-04 ENCOUNTER — Other Ambulatory Visit: Payer: Self-pay | Admitting: Nurse Practitioner

## 2020-10-04 DIAGNOSIS — G8929 Other chronic pain: Secondary | ICD-10-CM

## 2020-10-04 DIAGNOSIS — E782 Mixed hyperlipidemia: Secondary | ICD-10-CM

## 2020-10-04 DIAGNOSIS — I10 Essential (primary) hypertension: Secondary | ICD-10-CM

## 2020-10-04 DIAGNOSIS — F5101 Primary insomnia: Secondary | ICD-10-CM

## 2020-10-04 MED ORDER — LISINOPRIL 20 MG PO TABS: ORAL_TABLET | ORAL | 0 refills | Status: DC

## 2020-10-04 MED ORDER — ATORVASTATIN CALCIUM 40 MG PO TABS: 40.0000 mg | ORAL_TABLET | Freq: Every day | ORAL | 0 refills | Status: DC

## 2020-10-04 MED ORDER — DULOXETINE HCL 30 MG PO CPEP: ORAL_CAPSULE | ORAL | 0 refills | Status: DC

## 2020-10-04 NOTE — Telephone Encounter (Signed)
Requested medication (s) are due for refill today: no  Requested medication (s) are on the active medication list: yes   Future visit scheduled: yes  Notes to clinic: these refill cannot be delegated    Requested Prescriptions  Pending Prescriptions Disp Refills   QUEtiapine (SEROQUEL) 50 MG tablet 30 tablet 0    Sig: Take 1 tablet (50 mg total) by mouth at bedtime.      Not Delegated - Psychiatry:  Antipsychotics - Second Generation (Atypical) - quetiapine Failed - 10/04/2020 12:03 PM      Failed - This refill cannot be delegated      Failed - ALT in normal range and within 180 days    ALT  Date Value Ref Range Status  09/08/2019 30 0 - 32 IU/L Final          Failed - AST in normal range and within 180 days    AST  Date Value Ref Range Status  09/08/2019 32 0 - 40 IU/L Final          Failed - Valid encounter within last 6 months    Recent Outpatient Visits           1 year ago Encounter for annual physical exam   [place], Vernia Buff, NP   1 year ago Essential hypertension   [place], Vernia Buff, NP   1 year ago Essential hypertension   [place], [place] W, NP   1 year ago Essential hypertension   [place], Vernia Buff, NP       Future Appointments             In 1 month Gildardo Pounds, NP [place] BP in normal range    BP Readings from Last 1 Encounters:  10/08/19 124/60            cyclobenzaprine (FLEXERIL) 10 MG tablet 30 tablet 1    Sig: Take 1 tablet (10 mg total) by mouth 3 (three) times daily as needed for muscle spasms.      Not Delegated - Analgesics:  Muscle Relaxants Failed - 10/04/2020 12:03 PM      Failed - This refill cannot be delegated      Failed - Valid encounter within last 6 months    Recent Outpatient Visits            1 year ago Encounter for annual physical exam   [place], Vernia Buff, NP   1 year ago Essential hypertension   [place], Vernia Buff, NP   1 year ago Essential hypertension   [place], Zelda W, NP   1 year ago Essential hypertension   [place], Zelda W, NP       Future Appointments             In 1 month Gildardo Pounds, NP [place]              Signed Prescriptions Disp Refills   DULoxetine (CYMBALTA) 30 MG capsule 30 capsule 0    Sig: TAKE 1 CAPSULE BY MOUTH  DAILY      Psychiatry:  Antidepressants - SNRI Failed - 10/04/2020 12:03 PM      Failed - Valid encounter within last 6 months    Recent Outpatient Visits           1 year ago Encounter for annual physical exam   [place], Vernia Buff, NP   1 year ago Essential hypertension   [place], Vernia Buff, NP   1 year ago Essential hypertension   [place], Vernia Buff, NP   1 year ago Essential hypertension   [place], Zelda W, NP       Future Appointments             In 1 month Gildardo Pounds, NP [place] BP in normal range    BP Readings from Last 1 Encounters:  10/08/19 124/60            atorvastatin (LIPITOR) 40 MG tablet 30 tablet 0    Sig: Take 1 tablet (40 mg total) by mouth daily.      Cardiovascular:  Antilipid - Statins Failed - 10/04/2020 12:03 PM      Failed - Total Cholesterol in normal range and within 360 days    Cholesterol, Total  Date Value Ref Range Status  09/08/2019 195 100 - 199 mg/dL Final          Failed - LDL in normal range and within 360 days    LDL Chol Calc (NIH)   Date Value Ref Range Status  09/08/2019 68 0 - 99 mg/dL Final          Failed - HDL in normal range and within 360 days    HDL  Date Value Ref Range Status  09/08/2019 96 >39 mg/dL Final          Failed - Triglycerides in normal range and within 360 days    Triglycerides  Date Value Ref Range Status  09/08/2019 197 (H) 0 - 149 mg/dL Final          Failed - Valid encounter within last 12 months    Recent Outpatient Visits           1 year ago Encounter for annual physical exam   [place], Vernia Buff, NP   1 year ago Essential hypertension   [place], Vernia Buff, NP   1 year ago Essential hypertension   [place], Vernia Buff, NP   1 year ago Essential hypertension   [place], Vernia Buff, NP       Future Appointments             In 1 month Gildardo Pounds, NP [place] - Patient is not pregnant        lisinopril (ZESTRIL) 20 MG tablet 30 tablet 0    Sig: TAKE 1 TABLET(20 MG) BY MOUTH DAILY      Cardiovascular:  ACE Inhibitors Failed - 10/04/2020 12:03 PM      Failed - Cr in normal range and within 180 days    Creatinine, Ser  Date Value Ref Range  Status  09/08/2019 1.07 (H) 0.57 - 1.00 mg/dL Final          Failed - K in normal range and within 180 days    Potassium  Date Value Ref Range Status  09/08/2019 4.3 3.5 - 5.2 mmol/L Final          Failed - Valid encounter within last 6 months    Recent Outpatient Visits           1 year ago Encounter for annual physical exam   [place], Vernia Buff, NP   1 year ago Essential hypertension   [place], Vernia Buff, NP   1 year ago Essential hypertension   [place], Vernia Buff, NP   1 year ago  Essential hypertension   [place], Zelda W, NP       Future Appointments             In 1 month Gildardo Pounds, NP [place] - Patient is not pregnant      Passed - Last BP in normal range    BP Readings from Last 1 Encounters:  10/08/19 124/60

## 2020-10-04 NOTE — Telephone Encounter (Signed)
Short supply until appt  °

## 2020-10-04 NOTE — Telephone Encounter (Signed)
Medication: atorvastatin (LIPITOR) 40 MG tablet DULoxetine (CYMBALTA) 30 MG capsule lisinopril (ZESTRIL) 20 MG tablet QUEtiapine (SEROQUEL) 50 MG tablet cyclobenzaprine (FLEXERIL) 10 MG tablet  Has the pt contacted their pharmacy? NO she says there are no refills  Preferred pharmacy: [facility] [place], [address] AT [place]  Please be advised refills may take up to 3 business days.  We ask that you follow up with your pharmacy.

## 2020-11-16 ENCOUNTER — Other Ambulatory Visit: Payer: Self-pay | Admitting: Family Medicine

## 2020-11-16 DIAGNOSIS — E782 Mixed hyperlipidemia: Secondary | ICD-10-CM

## 2020-11-16 DIAGNOSIS — I10 Essential (primary) hypertension: Secondary | ICD-10-CM

## 2020-11-16 DIAGNOSIS — G8929 Other chronic pain: Secondary | ICD-10-CM

## 2020-11-16 NOTE — Telephone Encounter (Signed)
Requested medications are due for refill today yes  Requested medications are on the active medication list yes  Last refill 1/24  Last visit 08/2019  Future visit scheduled tomorrow  Notes to clinic labs are more than 6 days old, has appt tomorrow.

## 2020-11-17 ENCOUNTER — Other Ambulatory Visit: Payer: Self-pay | Admitting: Nurse Practitioner

## 2020-11-17 ENCOUNTER — Other Ambulatory Visit: Payer: Self-pay

## 2020-11-17 ENCOUNTER — Encounter: Payer: Self-pay | Admitting: Nurse Practitioner

## 2020-11-17 ENCOUNTER — Ambulatory Visit: Payer: Medicare Other | Attending: Nurse Practitioner | Admitting: Nurse Practitioner

## 2020-11-17 VITALS — BP 115/71 | HR 78 | Resp 16 | Wt 141.2 lb

## 2020-11-17 DIAGNOSIS — Z122 Encounter for screening for malignant neoplasm of respiratory organs: Secondary | ICD-10-CM | POA: Diagnosis not present

## 2020-11-17 DIAGNOSIS — Z8611 Personal history of tuberculosis: Secondary | ICD-10-CM

## 2020-11-17 DIAGNOSIS — I10 Essential (primary) hypertension: Secondary | ICD-10-CM

## 2020-11-17 DIAGNOSIS — Z79891 Long term (current) use of opiate analgesic: Secondary | ICD-10-CM

## 2020-11-17 DIAGNOSIS — Z1231 Encounter for screening mammogram for malignant neoplasm of breast: Secondary | ICD-10-CM

## 2020-11-17 DIAGNOSIS — F17200 Nicotine dependence, unspecified, uncomplicated: Secondary | ICD-10-CM

## 2020-11-17 DIAGNOSIS — M25562 Pain in left knee: Secondary | ICD-10-CM

## 2020-11-17 DIAGNOSIS — E559 Vitamin D deficiency, unspecified: Secondary | ICD-10-CM

## 2020-11-17 DIAGNOSIS — R7309 Other abnormal glucose: Secondary | ICD-10-CM

## 2020-11-17 DIAGNOSIS — D649 Anemia, unspecified: Secondary | ICD-10-CM

## 2020-11-17 DIAGNOSIS — E782 Mixed hyperlipidemia: Secondary | ICD-10-CM

## 2020-11-17 DIAGNOSIS — Z5181 Encounter for therapeutic drug level monitoring: Secondary | ICD-10-CM

## 2020-11-17 DIAGNOSIS — G8929 Other chronic pain: Secondary | ICD-10-CM

## 2020-11-17 MED ORDER — TRAMADOL HCL 50 MG PO TABS: 50.0000 mg | ORAL_TABLET | Freq: Two times a day (BID) | ORAL | 0 refills | Status: DC | PRN

## 2020-11-17 MED ORDER — ATORVASTATIN CALCIUM 40 MG PO TABS: 40.0000 mg | ORAL_TABLET | Freq: Every day | ORAL | 1 refills | Status: DC

## 2020-11-17 MED ORDER — DICLOFENAC SODIUM 1 % EX GEL: 2.0000 g | Freq: Four times a day (QID) | CUTANEOUS | 1 refills | Status: AC

## 2020-11-17 MED ORDER — ALBUTEROL SULFATE HFA 108 (90 BASE) MCG/ACT IN AERS: 2.0000 | INHALATION_SPRAY | Freq: Four times a day (QID) | RESPIRATORY_TRACT | 1 refills | Status: DC | PRN

## 2020-11-17 MED ORDER — LISINOPRIL 20 MG PO TABS: ORAL_TABLET | ORAL | 1 refills | Status: DC

## 2020-11-17 MED ORDER — DULOXETINE HCL 30 MG PO CPEP: ORAL_CAPSULE | ORAL | 0 refills | Status: DC

## 2020-11-17 MED ORDER — METHOCARBAMOL 750 MG PO TABS: 750.0000 mg | ORAL_TABLET | Freq: Three times a day (TID) | ORAL | 1 refills | Status: DC | PRN

## 2020-11-17 MED ORDER — FERROUS SULFATE 325 (65 FE) MG PO TBEC: 325.0000 mg | DELAYED_RELEASE_TABLET | Freq: Every day | ORAL | 2 refills | Status: DC

## 2020-11-17 NOTE — Progress Notes (Signed)
Judy Martinez was seen today for hypertension.  Diagnoses and all orders for this visit:  Essential hypertension -     lisinopril (ZESTRIL) 20 MG tablet; TAKE 1 TABLET(20 MG) BY MOUTH DAILY -     CMP14+EGFR -     VITAMIN D 25 Hydroxy (Vit-D Deficiency, Fractures) -     Ambulatory referral to Ophthalmology Continue all antihypertensives as prescribed.  Remember to bring in your blood pressure log with you for your follow up appointment.  DASH/Mediterranean Diets are healthier choices for HTN.    Encounter for screening for lung cancer -     CT CHEST LUNG CA SCREEN LOW DOSE W/O CM; Future Tobacco dependence -     CT CHEST LUNG CA SCREEN LOW DOSE W/O CM; Future  Chronic pain of left knee -     methocarbamol (ROBAXIN) 750 MG tablet; Take 1 tablet (750 mg total) by mouth every 8 (eight) hours as needed for muscle spasms. -     diclofenac Sodium (VOLTAREN) 1 % GEL; Apply 2 g topically 4 (four) times daily. -     DULoxetine (CYMBALTA) 30 MG capsule; TAKE 1 CAPSULE BY MOUTH  DAILY -     traMADol (ULTRAM) 50 MG tablet; Take 1 tablet (50 mg total) by mouth 2 (two) times daily as needed.  Mixed hyperlipidemia -     atorvastatin (LIPITOR) 40 MG tablet; Take 1 tablet (40 mg total) by mouth daily. -     Lipid panel INSTRUCTIONS: Work on a low fat, heart healthy diet and participate in regular aerobic exercise program by working out at least 150 minutes per week; 5 days a week-30 minutes per day. Avoid red meat/beef/steak,  fried foods. junk foods, sodas, sugary drinks, unhealthy snacking, alcohol and smoking.  Drink at least 80 oz of water per day and monitor your carbohydrate intake daily.    Anemia, unspecified type -     ferrous sulfate 325 (65 FE) MG EC tablet; Take 1 tablet (325 mg total) by mouth daily with breakfast. -     CBC  Vitamin D deficiency disease -     VITAMIN D 25 Hydroxy (Vit-D Deficiency, Fractures)  Elevated glucose level -     Hemoglobin A1c  History of TB  (tuberculosis) -     QuantiFERON-TB Gold Plus  Encounter for monitoring opioid maintenance therapy -     Drug Screen 10 W/Conf, Se  Breast cancer screening by mammogram -     MM 3D SCREEN BREAST BILATERAL; Future   Patient has been counseled on age-appropriate routine health concerns for screening and prevention. These are reviewed and up-to-date. Referrals have been placed accordingly. Immunizations are up-to-date or declined.    Subjective:   Chief Complaint  Patient presents with  . Hypertension   HPI Judy Martinez 70 y.o. female presents to office today for follow up.  I have not seen her in this office in over 2 years.  She has a past medical history of Arthritis (low back pain), Left kne OA, MI (2004), Hypertension, ETOH abuse, and Stroke (2015).  Chronic Pain She endorses Right shoulder pain, left knee pain and left sided posterior hip pain. She is s/p left TKR 05-2019. States she fell in the house a few months ago and hit her shoulder and fell on her back as well. She did not lose consciousness and denies any syncopal symptoms.   Tobacco dependence Started smoking 54 years ago. Currently down to .5ppd. She does endorse chronic cough with  thick white sputum.  TB States she has a history of TB and can not recall if she was treated for this.    Review of Systems  Constitutional: Negative for fever, malaise/fatigue and weight loss.  HENT: Negative.  Negative for nosebleeds.   Eyes: Negative.  Negative for blurred vision, double vision and photophobia.  Respiratory: Negative.  Negative for cough and shortness of breath.   Cardiovascular: Negative.  Negative for chest pain, palpitations and leg swelling.  Gastrointestinal: Negative.  Negative for heartburn, nausea and vomiting.  Musculoskeletal: Positive for joint pain. Negative for myalgias.  Neurological: Negative.  Negative for dizziness, focal weakness, seizures and headaches.  Psychiatric/Behavioral: Negative.   Negative for suicidal ideas.    Past Medical History:  Diagnosis Date  . Arthritis   . Heart attack ([place]) 2004  . Hypertension   . Stroke [facility]) 2015    Past Surgical History:  Procedure Laterality Date  . ABDOMINAL HYSTERECTOMY    . COLONOSCOPY  10/08/2019   previous in [place]  . TOTAL KNEE ARTHROPLASTY Left 06/02/2019   Procedure: LEFT TOTAL KNEE ARTHROPLASTY;  Surgeon: Leandrew Koyanagi, MD;  Location: [place];  Service: Orthopedics;  Laterality: Left;  . TUBAL LIGATION      Family History  Problem Relation Age of Onset  . Alcohol abuse Father   . Esophageal cancer Neg Hx   . Colon cancer Neg Hx   . Colon polyps Neg Hx   . Rectal cancer Neg Hx   . Stomach cancer Neg Hx     Social History Reviewed with no changes to be made today.   Outpatient Medications Prior to Visit  Medication Sig Dispense Refill  . senna-docusate (SENOKOT S) 8.6-50 MG tablet Take 1-2 tablets by mouth at bedtime as needed. 30 tablet 1  . vitamin B-12 (CYANOCOBALAMIN) 1000 MCG tablet Take 1,000 mcg by mouth every 7 (seven) days.    . Vitamin D, Ergocalciferol, (DRISDOL) 1.25 MG (50000 UT) CAPS capsule TAKE 1 CAPSULE BY MOUTH 1 TIME A WEEK 4 capsule 0  . albuterol (VENTOLIN HFA) 108 (90 Base) MCG/ACT inhaler INHALE 2 PUFFS INTO THE LUNGS EVERY 6 HOURS AS NEEDED FOR WHEEZING OR SHORTNESS OF BREATH 8.5 g 2  . aspirin EC 81 MG tablet Take 1 tablet (81 mg total) by mouth 2 (two) times daily. 84 tablet 0  . atorvastatin (LIPITOR) 40 MG tablet Take 1 tablet (40 mg total) by mouth daily. 30 tablet 0  . cyclobenzaprine (FLEXERIL) 10 MG tablet Take 1 tablet (10 mg total) by mouth 3 (three) times daily as needed for muscle spasms. 30 tablet 1  . dipyridamole-aspirin (AGGRENOX) 200-25 MG 12hr capsule TAKE 1 CAPSULE BY MOUTH DAILY 90 capsule 0  . DULoxetine (CYMBALTA) 30 MG capsule TAKE 1 CAPSULE BY MOUTH  DAILY 30 capsule 0  . ferrous sulfate 325 (65 FE) MG EC tablet Take 1 tablet (325 mg total) by mouth daily with  breakfast. 90 tablet 2  . HYDROcodone-acetaminophen (NORCO) 5-325 MG tablet Take 1-2 tablets by mouth daily as needed. (Patient not taking: Reported on 05/27/2020) 30 tablet 0  . lisinopril (ZESTRIL) 20 MG tablet TAKE 1 TABLET(20 MG) BY MOUTH DAILY 30 tablet 0  . methocarbamol (ROBAXIN) 750 MG tablet Take 750 mg by mouth daily.    . naproxen (NAPROSYN) 500 MG tablet TAKE 1 TABLET(500 MG) BY MOUTH TWICE DAILY WITH A MEAL 30 tablet 3  . ondansetron (ZOFRAN) 4 MG tablet Take 1-2 tablets (4-8 mg total) by mouth  every 8 (eight) hours as needed for nausea or vomiting. 40 tablet 0  . promethazine (PHENERGAN) 25 MG tablet Take 1 tablet (25 mg total) by mouth every 6 (six) hours as needed for nausea. 30 tablet 1  . QUEtiapine (SEROQUEL) 50 MG tablet Take 1 tablet (50 mg total) by mouth at bedtime. 30 tablet 0  . traMADol (ULTRAM) 50 MG tablet Take 1 tablet (50 mg total) by mouth 2 (two) times daily as needed. 30 tablet 0   No facility-administered medications prior to visit.    Allergies  Allergen Reactions  . Penicillins Anaphylaxis and Hives    Did it involve swelling of the face/tongue/throat, SOB, or low BP? Yes Did it involve sudden or severe rash/hives, skin peeling, or any reaction on the inside of your mouth or nose? No Did you need to seek medical attention at a hospital or doctor's office? Yes When did it last happen?Over 10 years ago If all above answers are "NO", may proceed with cephalosporin use.        Objective:    BP 115/71   Pulse 78   Resp 16   Wt 141 lb 3.2 oz (64 kg)   LMP  (LMP Unknown)   SpO2 97%   BMI 25.01 kg/m  Wt Readings from Last 3 Encounters:  11/17/20 141 lb 3.2 oz (64 kg)  05/27/20 134 lb (60.8 kg)  10/08/19 139 lb (63 kg)    Physical Exam Vitals and nursing note reviewed.  Constitutional:      Appearance: She is well-developed.  HENT:     Head: Normocephalic and atraumatic.  Eyes:     Conjunctiva/sclera:     Left eye: Hemorrhage present.   Cardiovascular:     Rate and Rhythm: Normal rate and regular rhythm.     Heart sounds: Normal heart sounds. No murmur heard. No friction rub. No gallop.   Pulmonary:     Effort: Pulmonary effort is normal. No tachypnea or respiratory distress.     Breath sounds: Normal breath sounds. No decreased breath sounds, wheezing, rhonchi or rales.  Chest:     Chest wall: No tenderness.  Abdominal:     General: Bowel sounds are normal.     Palpations: Abdomen is soft.  Musculoskeletal:     Cervical back: Normal range of motion.     Lumbar back: Positive left straight leg raise test.  Skin:    General: Skin is warm and dry.  Neurological:     Mental Status: She is alert and oriented to person, place, and time.     Coordination: Coordination normal.  Psychiatric:        Behavior: Behavior normal. Behavior is cooperative.        Thought Content: Thought content normal.        Judgment: Judgment normal.          Patient has been counseled extensively about nutrition and exercise as well as the importance of adherence with medications and regular follow-up. The patient was given clear instructions to go to ER or return to medical center if symptoms don't improve, worsen or new problems develop. The patient verbalized understanding.   Follow-up: Return for Physical.   Gildardo Pounds, FNP-BC [facility] [place], [place]   11/20/2020, 10:20 PM

## 2020-11-17 NOTE — Progress Notes (Signed)
Pt states she is having pain right shoulder

## 2020-11-17 NOTE — Telephone Encounter (Signed)
Last seen today.

## 2020-11-17 NOTE — Telephone Encounter (Signed)
Requested medications are due for refill today.  yes  Requested medications are on the active medications list.  yes  Last refill. 10/04/2020  Future visit scheduled.   Yes - Today  Notes to clinic.  Declined per protocol - Pt has OV today.

## 2020-11-20 ENCOUNTER — Encounter: Payer: Self-pay | Admitting: Nurse Practitioner

## 2020-11-20 MED ORDER — VITAMIN D (ERGOCALCIFEROL) 1.25 MG (50000 UNIT) PO CAPS: ORAL_CAPSULE | ORAL | 0 refills | Status: DC

## 2020-11-27 LAB — DRUG SCREEN 10 W/CONF, SERUM
Amphetamines, IA: NEGATIVE ng/mL
Barbiturates, IA: NEGATIVE ug/mL
Benzodiazepines, IA: NEGATIVE ng/mL
Cocaine & Metabolite, IA: POSITIVE ng/mL — AB
Methadone, IA: NEGATIVE ng/mL
Opiates, IA: NEGATIVE ng/mL
Oxycodones, IA: NEGATIVE ng/mL
Phencyclidine, IA: NEGATIVE ng/mL
Propoxyphene, IA: NEGATIVE ng/mL
THC(Marijuana) Metabolite, IA: POSITIVE ng/mL — AB

## 2020-11-27 LAB — VITAMIN D 25 HYDROXY (VIT D DEFICIENCY, FRACTURES): Vit D, 25-Hydroxy: 17.6 ng/mL — ABNORMAL LOW (ref 30.0–100.0)

## 2020-11-27 LAB — LIPID PANEL
Chol/HDL Ratio: 2.5 ratio (ref 0.0–4.4)
Cholesterol, Total: 180 mg/dL (ref 100–199)
HDL: 72 mg/dL (ref >39)
LDL Chol Calc (NIH): 62 mg/dL (ref 0–99)
Triglycerides: 300 mg/dL — ABNORMAL HIGH (ref 0–149)
VLDL Cholesterol Cal: 46 mg/dL — ABNORMAL HIGH (ref 5–40)

## 2020-11-27 LAB — CMP14+EGFR
ALT: 27 IU/L (ref 0–32)
AST: 28 IU/L (ref 0–40)
Albumin/Globulin Ratio: 1.7 (ref 1.2–2.2)
Albumin: 4.7 g/dL (ref 3.8–4.8)
Alkaline Phosphatase: 82 IU/L (ref 44–121)
BUN/Creatinine Ratio: 23 (ref 12–28)
BUN: 22 mg/dL (ref 8–27)
Bilirubin Total: <0.2 mg/dL (ref 0.0–1.2)
CO2: 19 mmol/L — ABNORMAL LOW (ref 20–29)
Calcium: 9.8 mg/dL (ref 8.7–10.3)
Chloride: 102 mmol/L (ref 96–106)
Creatinine, Ser: 0.96 mg/dL (ref 0.57–1.00)
Globulin, Total: 2.8 g/dL (ref 1.5–4.5)
Glucose: 110 mg/dL — ABNORMAL HIGH (ref 65–99)
Potassium: 3.8 mmol/L (ref 3.5–5.2)
Sodium: 138 mmol/L (ref 134–144)
Total Protein: 7.5 g/dL (ref 6.0–8.5)
eGFR: 64 mL/min/{1.73_m2} (ref >59)

## 2020-11-27 LAB — QUANTIFERON-TB GOLD PLUS
QuantiFERON Mitogen Value: >10 IU/mL
QuantiFERON Nil Value: 0.07 IU/mL
QuantiFERON TB1 Ag Value: 0.16 IU/mL
QuantiFERON TB2 Ag Value: 0.13 IU/mL
QuantiFERON-TB Gold Plus: NEGATIVE

## 2020-11-27 LAB — COCAINE,MS,WB/SP RFX
Benzoylecgonine: 179 ng/mL
Cocaine Confirmation: POSITIVE
Cocaine: NEGATIVE ng/mL

## 2020-11-27 LAB — CBC
Hematocrit: 37.5 % (ref 34.0–46.6)
Hemoglobin: 12.4 g/dL (ref 11.1–15.9)
MCH: 31.4 pg (ref 26.6–33.0)
MCHC: 33.1 g/dL (ref 31.5–35.7)
MCV: 95 fL (ref 79–97)
Platelets: 335 10*3/uL (ref 150–450)
RBC: 3.95 x10E6/uL (ref 3.77–5.28)
RDW: 13 % (ref 11.7–15.4)
WBC: 7.8 10*3/uL (ref 3.4–10.8)

## 2020-11-27 LAB — THC,MS,WB/SP RFX
Cannabidiol: NEGATIVE ng/mL
Cannabinoid Confirmation: POSITIVE
Cannabinol: NEGATIVE ng/mL
Tetrahydrocannabinol(THC): 1.2 ng/mL

## 2020-11-27 LAB — HEMOGLOBIN A1C
Est. average glucose Bld gHb Est-mCnc: 137 mg/dL
Hgb A1c MFr Bld: 6.4 % — ABNORMAL HIGH (ref 4.8–5.6)

## 2020-11-29 ENCOUNTER — Telehealth: Payer: Self-pay | Admitting: Nurse Practitioner

## 2020-11-29 DIAGNOSIS — F5101 Primary insomnia: Secondary | ICD-10-CM

## 2020-11-29 DIAGNOSIS — M25562 Pain in left knee: Secondary | ICD-10-CM

## 2020-11-29 DIAGNOSIS — G8929 Other chronic pain: Secondary | ICD-10-CM

## 2020-11-30 ENCOUNTER — Ambulatory Visit (HOSPITAL_COMMUNITY)
Admission: RE | Admit: 2020-11-30 | Discharge: 2020-11-30 | Disposition: A | Discharge status: Home / Self Care | Payer: Medicare Other | Source: Ambulatory Visit | Attending: Nurse Practitioner | Admitting: Nurse Practitioner

## 2020-11-30 ENCOUNTER — Other Ambulatory Visit: Payer: Self-pay

## 2020-11-30 DIAGNOSIS — Z122 Encounter for screening for malignant neoplasm of respiratory organs: Secondary | ICD-10-CM | POA: Diagnosis not present

## 2020-11-30 DIAGNOSIS — J439 Emphysema, unspecified: Secondary | ICD-10-CM | POA: Insufficient documentation

## 2020-11-30 DIAGNOSIS — I700 Atherosclerosis of aorta: Secondary | ICD-10-CM | POA: Diagnosis not present

## 2020-11-30 DIAGNOSIS — F17210 Nicotine dependence, cigarettes, uncomplicated: Secondary | ICD-10-CM | POA: Diagnosis not present

## 2020-11-30 DIAGNOSIS — F17200 Nicotine dependence, unspecified, uncomplicated: Secondary | ICD-10-CM

## 2020-12-01 ENCOUNTER — Telehealth: Payer: Self-pay | Admitting: Nurse Practitioner

## 2020-12-01 NOTE — Telephone Encounter (Signed)
Provider on [facility] 4/25. Called Pt no answer left vm to call [phone] to reschedule appt.

## 2020-12-01 NOTE — Telephone Encounter (Signed)
Pt called stating that she is completely out of these medications. Please advise.

## 2020-12-02 NOTE — Telephone Encounter (Signed)
Will forward to provider  

## 2020-12-03 ENCOUNTER — Other Ambulatory Visit: Payer: Self-pay | Admitting: Nurse Practitioner

## 2020-12-03 MED ORDER — QUETIAPINE FUMARATE 50 MG PO TABS: 50.0000 mg | ORAL_TABLET | Freq: Every day | ORAL | 0 refills | Status: DC

## 2020-12-03 NOTE — Telephone Encounter (Signed)
Contacted pt and made aware that provider denied her refill request pt asked why and provided pt the reason pt stated okay and hung up

## 2020-12-03 NOTE — Telephone Encounter (Signed)
Denied> positive drug screen

## 2021-01-03 ENCOUNTER — Encounter: Payer: Medicare Other | Admitting: Nurse Practitioner

## 2021-01-27 ENCOUNTER — Other Ambulatory Visit: Payer: Self-pay | Admitting: Nurse Practitioner

## 2021-01-27 DIAGNOSIS — G8929 Other chronic pain: Secondary | ICD-10-CM

## 2021-01-27 DIAGNOSIS — M25562 Pain in left knee: Secondary | ICD-10-CM

## 2021-02-08 ENCOUNTER — Encounter: Payer: Medicare Other | Admitting: Nurse Practitioner

## 2021-02-14 ENCOUNTER — Other Ambulatory Visit: Payer: Self-pay | Admitting: Nurse Practitioner

## 2021-02-14 DIAGNOSIS — M25562 Pain in left knee: Secondary | ICD-10-CM

## 2021-02-14 DIAGNOSIS — G8929 Other chronic pain: Secondary | ICD-10-CM

## 2021-03-11 ENCOUNTER — Other Ambulatory Visit: Payer: Self-pay | Admitting: Nurse Practitioner

## 2021-03-11 NOTE — Telephone Encounter (Signed)
Requested medication (s) are due for refill today: yes  Requested medication (s) are on the active medication list: yes  Last refill:  12/03/20 #90 0 refills  Future visit scheduled: yes in 2 weeks  Notes to clinic:  not delegated per protocol     Requested Prescriptions  Pending Prescriptions Disp Refills   QUEtiapine (SEROQUEL) 50 MG tablet [Pharmacy Med Name: QUETIAPINE 50MG   TABLETS] 90 tablet 0    Sig: TAKE 1 TABLET(50 MG) BY MOUTH AT BEDTIME      Not Delegated - Psychiatry:  Antipsychotics - Second Generation (Atypical) - quetiapine Failed - 03/11/2021  4:11 PM      Failed - This refill cannot be delegated      Passed - ALT in normal range and within 180 days    ALT  Date Value Ref Range Status  11/17/2020 27 0 - 32 IU/L Final          Passed - AST in normal range and within 180 days    AST  Date Value Ref Range Status  11/17/2020 28 0 - 40 IU/L Final          Passed - Last BP in normal range    BP Readings from Last 1 Encounters:  11/17/20 115/71          Passed - Valid encounter within last 6 months    Recent Outpatient Visits           3 months ago Essential hypertension   [place], Vernia Buff, NP   1 year ago Encounter for annual physical exam   [place], Vernia Buff, NP   1 year ago Essential hypertension   [place], Vernia Buff, NP   2 years ago Essential hypertension   [place], Vernia Buff, NP   2 years ago Essential hypertension   [place], Vernia Buff, NP       Future Appointments             In 2 weeks Gildardo Pounds, NP [place]

## 2021-03-28 ENCOUNTER — Other Ambulatory Visit: Payer: Self-pay

## 2021-03-28 ENCOUNTER — Ambulatory Visit: Payer: Medicare Other | Attending: Nurse Practitioner | Admitting: Nurse Practitioner

## 2021-03-28 ENCOUNTER — Ambulatory Visit (HOSPITAL_COMMUNITY)
Admission: RE | Admit: 2021-03-28 | Discharge: 2021-03-28 | Disposition: A | Discharge status: Home / Self Care | Payer: Medicare Other | Source: Ambulatory Visit | Attending: Nurse Practitioner | Admitting: Nurse Practitioner

## 2021-03-28 ENCOUNTER — Encounter: Payer: Self-pay | Admitting: Nurse Practitioner

## 2021-03-28 VITALS — BP 149/85 | HR 75 | Resp 16 | Ht 63.0 in | Wt 156.4 lb

## 2021-03-28 DIAGNOSIS — M25562 Pain in left knee: Secondary | ICD-10-CM | POA: Insufficient documentation

## 2021-03-28 DIAGNOSIS — M25462 Effusion, left knee: Secondary | ICD-10-CM | POA: Diagnosis not present

## 2021-03-28 DIAGNOSIS — F5104 Psychophysiologic insomnia: Secondary | ICD-10-CM

## 2021-03-28 DIAGNOSIS — M1612 Unilateral primary osteoarthritis, left hip: Secondary | ICD-10-CM | POA: Diagnosis not present

## 2021-03-28 DIAGNOSIS — G8929 Other chronic pain: Secondary | ICD-10-CM | POA: Insufficient documentation

## 2021-03-28 DIAGNOSIS — Z0000 Encounter for general adult medical examination without abnormal findings: Secondary | ICD-10-CM | POA: Diagnosis not present

## 2021-03-28 DIAGNOSIS — Z471 Aftercare following joint replacement surgery: Secondary | ICD-10-CM | POA: Diagnosis not present

## 2021-03-28 DIAGNOSIS — M25552 Pain in left hip: Secondary | ICD-10-CM

## 2021-03-28 DIAGNOSIS — I10 Essential (primary) hypertension: Secondary | ICD-10-CM | POA: Diagnosis not present

## 2021-03-28 DIAGNOSIS — Z96652 Presence of left artificial knee joint: Secondary | ICD-10-CM | POA: Diagnosis not present

## 2021-03-28 DIAGNOSIS — E785 Hyperlipidemia, unspecified: Secondary | ICD-10-CM

## 2021-03-28 DIAGNOSIS — R109 Unspecified abdominal pain: Secondary | ICD-10-CM | POA: Diagnosis not present

## 2021-03-28 LAB — POCT URINALYSIS DIP (CLINITEK)
Bilirubin, UA: NEGATIVE
Blood, UA: NEGATIVE
Glucose, UA: NEGATIVE mg/dL
Ketones, POC UA: NEGATIVE mg/dL
Leukocytes, UA: NEGATIVE
Nitrite, UA: NEGATIVE
POC PROTEIN,UA: NEGATIVE
Spec Grav, UA: >=1.03 — AB (ref 1.010–1.025)
Urobilinogen, UA: 0.2 E.U./dL
pH, UA: 5.5 (ref 5.0–8.0)

## 2021-03-28 MED ORDER — LISINOPRIL 20 MG PO TABS: ORAL_TABLET | ORAL | 1 refills | Status: DC

## 2021-03-28 MED ORDER — TRAMADOL HCL 50 MG PO TABS: 50.0000 mg | ORAL_TABLET | Freq: Two times a day (BID) | ORAL | 0 refills | Status: DC | PRN

## 2021-03-28 MED ORDER — QUETIAPINE FUMARATE 50 MG PO TABS: 50.0000 mg | ORAL_TABLET | Freq: Every day | ORAL | 1 refills | Status: DC

## 2021-03-28 MED ORDER — LISINOPRIL 10 MG PO TABS: ORAL_TABLET | ORAL | 0 refills | Status: DC

## 2021-03-28 NOTE — Progress Notes (Signed)
Assessment & Plan:  Judy Martinez was seen today for annual exam.  Diagnoses and all orders for this visit:  Encounter for annual wellness exam in Medicare patient  Left flank pain -     POCT URINALYSIS DIP (CLINITEK)  Essential hypertension -     CMP14+EGFR -     Discontinue: lisinopril (ZESTRIL) 20 MG tablet; TAKE 1 TABLET(20 MG) BY MOUTH DAILY. FOR BLOOD PRESSURE -     lisinopril (ZESTRIL) 10 MG tablet; TAKE 1 TABLET(10 MG) BY MOUTH DAILY. FOR BLOOD PRESSURE Continue all antihypertensives as prescribed.  Remember to bring in your blood pressure log with you for your follow up appointment.  DASH/Mediterranean Diets are healthier choices for HTN.    Dyslipidemia, goal LDL below 100 -     Lipid panel INSTRUCTIONS: Work on a low fat, heart healthy diet and participate in regular aerobic exercise program by working out at least 150 minutes per week; 5 days a week-30 minutes per day. Avoid red meat/beef/steak,  fried foods. junk foods, sodas, sugary drinks, unhealthy snacking, alcohol and smoking.  Drink at least 80 oz of water per day and monitor your carbohydrate intake daily.   Chronic left hip pain -     DG Hip Unilat W OR W/O Pelvis 2-3 Views Left; Future  Chronic pain of left knee -     DG Knee Complete 4 Views Left; Future -     traMADol (ULTRAM) 50 MG tablet; Take 1 tablet (50 mg total) by mouth 2 (two) times daily as needed. FOR KNEE AND HIP PAIN  Chronic insomnia -     QUEtiapine (SEROQUEL) 50 MG tablet; Take 1 tablet (50 mg total) by mouth at bedtime.   Patient has been counseled on age-appropriate routine health concerns for screening and prevention. These are reviewed and up-to-date. Referrals have been placed accordingly. Immunizations are up-to-date or declined.    Subjective:   Chief Complaint  Patient presents with   Annual Exam   HPI Judy Martinez 70 y.o. female presents to office today for annual wellness exam.  PMH significant for  Arthritis, Heart attack  (2004), Hypertension, L knee OA with TKR 05-2020 and Stroke (2015).   Essential Hypertension Blood pressure is elevated. She states she has not been taking lisinopril as she was not aware that she had high blood pressure. Will restart low dose ACE at this time. Denies chest pain, shortness of breath, palpitations, lightheadedness, dizziness, headaches or BLE edema.   BP Readings from Last 3 Encounters:  03/28/21 (!) 149/85  11/17/20 115/71  10/08/19 124/60    Joint/Muscle Pain: Patient complains of arthralgias for which has been present for several months. Pain is located in  the left knee and left hip , is described as aching, sharp, shooting, and throbbing, and is constant, moderate, severe .  She has a history of left knee OA and L TKR last year as well as chronic low back pain with left sided sciatica. UA today was normal. Associated symptoms include: decreased range of motion.  The patient has tried NSAIDs for pain, with no relief.  Related to injury:   no.   Review of Systems  Constitutional:  Negative for fever, malaise/fatigue and weight loss.  HENT: Negative.  Negative for nosebleeds.   Eyes: Negative.  Negative for blurred vision, double vision and photophobia.  Respiratory: Negative.  Negative for cough and shortness of breath.   Cardiovascular: Negative.  Negative for chest pain, palpitations and leg swelling.  Gastrointestinal: Negative.  Negative for heartburn, nausea and vomiting.  Genitourinary:  Positive for flank pain.  Musculoskeletal:  Positive for joint pain. Negative for myalgias.  Skin: Negative.   Neurological: Negative.  Negative for dizziness, focal weakness, seizures and headaches.  Endo/Heme/Allergies: Negative.   Psychiatric/Behavioral:  Negative for suicidal ideas. The patient has insomnia.    Past Medical History:  Diagnosis Date   Arthritis    Heart attack ([place]) 2004   Hypertension    Stroke [facility]) 2015    Past Surgical History:  Procedure Laterality  Date   ABDOMINAL HYSTERECTOMY     COLONOSCOPY  10/08/2019   previous in [place] Left 06/02/2019   Procedure: LEFT TOTAL KNEE ARTHROPLASTY;  Surgeon: Leandrew Koyanagi, MD;  Location: [place];  Service: Orthopedics;  Laterality: Left;   TUBAL LIGATION      Family History  Problem Relation Age of Onset   Alcohol abuse Father    Esophageal cancer Neg Hx    Colon cancer Neg Hx    Colon polyps Neg Hx    Rectal cancer Neg Hx    Stomach cancer Neg Hx     Social History Reviewed with no changes to be made today.   Outpatient Medications Prior to Visit  Medication Sig Dispense Refill   albuterol (VENTOLIN HFA) 108 (90 Base) MCG/ACT inhaler INHALE 2 PUFFS INTO THE LUNGS EVERY 6 HOURS AS NEEDED FOR WHEEZING OR SHORTNESS OF BREATH (Patient not taking: Reported on 03/28/2021) 54 g 0   vitamin B-12 (CYANOCOBALAMIN) 1000 MCG tablet Take 1,000 mcg by mouth every 7 (seven) days.     Vitamin D, Ergocalciferol, (DRISDOL) 1.25 MG (50000 UNIT) CAPS capsule TAKE 1 CAPSULE BY MOUTH 1 TIME A WEEK (Patient not taking: Reported on 03/28/2021) 12 capsule 0   atorvastatin (LIPITOR) 40 MG tablet Take 1 tablet (40 mg total) by mouth daily. 90 tablet 1   DULoxetine (CYMBALTA) 30 MG capsule TAKE 1 CAPSULE BY MOUTH DAILY (Patient not taking: Reported on 03/28/2021) 90 capsule 0   ferrous sulfate 325 (65 FE) MG EC tablet Take 1 tablet (325 mg total) by mouth daily with breakfast. 90 tablet 2   lisinopril (ZESTRIL) 20 MG tablet TAKE 1 TABLET(20 MG) BY MOUTH DAILY (Patient not taking: Reported on 03/28/2021) 90 tablet 1   methocarbamol (ROBAXIN) 750 MG tablet Take 1 tablet (750 mg total) by mouth every 8 (eight) hours as needed for muscle spasms. (Patient not taking: Reported on 03/28/2021) 60 tablet 1   QUEtiapine (SEROQUEL) 50 MG tablet TAKE 1 TABLET(50 MG) BY MOUTH AT BEDTIME 90 tablet 0   senna-docusate (SENOKOT S) 8.6-50 MG tablet Take 1-2 tablets by mouth at bedtime as needed. (Patient not taking:  Reported on 03/28/2021) 30 tablet 1   traMADol (ULTRAM) 50 MG tablet Take 1 tablet (50 mg total) by mouth 2 (two) times daily as needed. (Patient not taking: Reported on 03/28/2021) 30 tablet 0   No facility-administered medications prior to visit.    Allergies  Allergen Reactions   Penicillins Anaphylaxis and Hives    Did it involve swelling of the face/tongue/throat, SOB, or low BP? Yes Did it involve sudden or severe rash/hives, skin peeling, or any reaction on the inside of your mouth or nose? No Did you need to seek medical attention at a hospital or doctor's office? Yes When did it last happen?    Over 10 years ago   If all above answers are "NO", may proceed with cephalosporin use.  Objective:    BP (!) 149/85   Pulse 75   Resp 16   Ht '5\' 3"'  (1.6 m)   Wt 156 lb 6.4 oz (70.9 kg)   LMP  (LMP Unknown)   SpO2 95%   BMI 27.71 kg/m  Wt Readings from Last 3 Encounters:  03/28/21 156 lb 6.4 oz (70.9 kg)  11/17/20 141 lb 3.2 oz (64 kg)  05/27/20 134 lb (60.8 kg)    Physical Exam Vitals and nursing note reviewed.  Constitutional:      Appearance: She is well-developed.  HENT:     Head: Normocephalic and atraumatic.  Eyes:     Extraocular Movements: Extraocular movements intact.  Cardiovascular:     Rate and Rhythm: Normal rate and regular rhythm.     Heart sounds: Normal heart sounds. No murmur heard.   No friction rub. No gallop.  Pulmonary:     Effort: Pulmonary effort is normal. No tachypnea or respiratory distress.     Breath sounds: Normal breath sounds. No decreased breath sounds, wheezing, rhonchi or rales.  Chest:     Chest wall: No tenderness.  Abdominal:     General: Bowel sounds are normal.     Palpations: Abdomen is soft.  Musculoskeletal:     Cervical back: Normal range of motion.     Left hip: Tenderness present. No deformity, lacerations, bony tenderness or crepitus. Normal range of motion. Decreased strength.     Right knee: Normal.     Left  knee: Swelling and deformity present. Decreased range of motion. Tenderness present over the medial joint line and patellar tendon.  Skin:    General: Skin is warm and dry.  Neurological:     Mental Status: She is alert and oriented to person, place, and time.     Coordination: Coordination normal.  Psychiatric:        Behavior: Behavior normal. Behavior is cooperative.        Thought Content: Thought content normal.        Judgment: Judgment normal.         Patient has been counseled extensively about nutrition and exercise as well as the importance of adherence with medications and regular follow-up. The patient was given clear instructions to go to ER or return to medical center if symptoms don't improve, worsen or new problems develop. The patient verbalized understanding.   Follow-up: Return in about 3 months (around 06/28/2021).   Gildardo Pounds, FNP-BC [facility] [place], [place]   03/28/2021, 11:22 AM

## 2021-03-29 ENCOUNTER — Other Ambulatory Visit: Payer: Self-pay | Admitting: Nurse Practitioner

## 2021-03-29 DIAGNOSIS — M1612 Unilateral primary osteoarthritis, left hip: Secondary | ICD-10-CM

## 2021-03-29 DIAGNOSIS — M25462 Effusion, left knee: Secondary | ICD-10-CM

## 2021-03-29 LAB — CMP14+EGFR
ALT: 24 IU/L (ref 0–32)
AST: 25 IU/L (ref 0–40)
Albumin/Globulin Ratio: 1.8 (ref 1.2–2.2)
Albumin: 4.4 g/dL (ref 3.8–4.8)
Alkaline Phosphatase: 94 IU/L (ref 44–121)
BUN/Creatinine Ratio: 16 (ref 12–28)
BUN: 15 mg/dL (ref 8–27)
Bilirubin Total: <0.2 mg/dL (ref 0.0–1.2)
CO2: 24 mmol/L (ref 20–29)
Calcium: 9.7 mg/dL (ref 8.7–10.3)
Chloride: 103 mmol/L (ref 96–106)
Creatinine, Ser: 0.91 mg/dL (ref 0.57–1.00)
Globulin, Total: 2.5 g/dL (ref 1.5–4.5)
Glucose: 149 mg/dL — ABNORMAL HIGH (ref 65–99)
Potassium: 4 mmol/L (ref 3.5–5.2)
Sodium: 141 mmol/L (ref 134–144)
Total Protein: 6.9 g/dL (ref 6.0–8.5)
eGFR: 68 mL/min/{1.73_m2} (ref >59)

## 2021-03-29 LAB — LIPID PANEL
Chol/HDL Ratio: 2.8 ratio (ref 0.0–4.4)
Cholesterol, Total: 153 mg/dL (ref 100–199)
HDL: 55 mg/dL (ref >39)
LDL Chol Calc (NIH): 63 mg/dL (ref 0–99)
Triglycerides: 213 mg/dL — ABNORMAL HIGH (ref 0–149)
VLDL Cholesterol Cal: 35 mg/dL (ref 5–40)

## 2021-04-01 ENCOUNTER — Telehealth: Payer: Self-pay

## 2021-04-01 NOTE — Telephone Encounter (Signed)
-----   Message from Gildardo Pounds, NP sent at 03/29/2021 10:20 AM EDT ----- X-ray shows mild arthritis in the left hip.  Left knee x-ray showing small knee effusion or fluid in the knee.  Will refer to orthopedics for further evaluation

## 2021-04-01 NOTE — Telephone Encounter (Signed)
Patient was called and a voicemail was left informing patient to return phone call for lab results.   CRM created.

## 2021-04-06 ENCOUNTER — Ambulatory Visit: Payer: Self-pay

## 2021-04-06 ENCOUNTER — Ambulatory Visit (INDEPENDENT_AMBULATORY_CARE_PROVIDER_SITE_OTHER): Payer: Medicare Other | Admitting: Orthopaedic Surgery

## 2021-04-06 DIAGNOSIS — M1612 Unilateral primary osteoarthritis, left hip: Secondary | ICD-10-CM

## 2021-04-06 DIAGNOSIS — Z96652 Presence of left artificial knee joint: Secondary | ICD-10-CM | POA: Diagnosis not present

## 2021-04-06 NOTE — Progress Notes (Signed)
Office Visit Note   Patient: Judy Martinez           Date of Birth: 24-Nov-1950           MRN: [id number] Visit Date: 04/06/2021              Requested by: Gildardo Pounds, NP [place],  [place] [address] PCP: Gildardo Pounds, NP   Assessment & Plan: Visit Diagnoses:  1. Hx of total knee replacement, left   2. Unilateral primary osteoarthritis, left hip     Plan: Impression is 2 years status post left total knee replacement and left hip pain.  In regards to the knee, I believe the majority of her pain is actually coming from her hip based on what she is telling me in addition to her clinical exam.  I would like to refer her to Dr. Junius Roads for ultrasound-guided cortisone injection to the left hip joint.  She will follow-up with [place] in 1 years time for recheck of her left total knee replacement we will obtain 2 view x-rays of the left knee.  Call with concerns or questions.  Follow-Up Instructions: Return in about 1 year (around 04/06/2022).   Orders:  No orders of the defined types were placed in this encounter.  No orders of the defined types were placed in this encounter.     Procedures: No procedures performed   Clinical Data: No additional findings.   Subjective: Chief Complaint  Patient presents with   Left Knee - Pain   Left Hip - Pain    HPI patient is a 70 year old female who comes in today nearly 2 years out left total knee replacement, date of surgery 06/02/2019.  She has been struggling a little with knee pain over the past few years due to lack of quadricep strength.  We sent her to formal physical therapy last year for this which did seem to help.  Over the past several weeks she has noticed increased pain to the left lower back rating into the groin, anterior thigh and into the knee.  Pain appears to be worse after she has been sitting or lying down for period of time and then goes to stand up.  She has been taking Tylenol without significant  relief.  She denies any paresthesias to the left lower extremity.  Review of Systems as detailed in HPI.  All others reviewed and are negative.   Objective: Vital Signs: LMP  (LMP Unknown)   Physical Exam well-developed well-nourished female no acute distress.  Alert and oriented x3.  Ortho Exam left hip exam shows a moderately positive logroll and FADIR.  Positive straight leg raise.  Knee exam shows range of motion from 0 to 100 degrees.  No joint line tenderness.  Ligaments are stable.  She is neurovascular intact distally.  Specialty Comments:  No specialty comments available.  Imaging: Recent x-rays reviewed by me in [place] show a well-seated left knee prosthesis.  She does have moderate degenerative changes to the left hip.   PMFS History: Patient Active Problem List   Diagnosis Date Noted   Status post total knee replacement, left 06/17/2019   Status post total left knee replacement 06/02/2019   Primary osteoarthritis of left knee    Elevated liver enzymes 09/25/2018   Cocaine use 09/25/2018   Smoker unmotivated to quit 09/25/2018   Chronic bilateral low back pain with sciatica 08/28/2018   Chronic pain of left knee 08/28/2018   Alcohol  abuse    Chronic insomnia    Hyperlipidemia    Essential hypertension    History of CVA (cerebrovascular accident)    Past Medical History:  Diagnosis Date   Arthritis    Heart attack ([place]) 2004   Hypertension    Stroke [facility]) 2015    Family History  Problem Relation Age of Onset   Alcohol abuse Father    Esophageal cancer Neg Hx    Colon cancer Neg Hx    Colon polyps Neg Hx    Rectal cancer Neg Hx    Stomach cancer Neg Hx     Past Surgical History:  Procedure Laterality Date   ABDOMINAL HYSTERECTOMY     COLONOSCOPY  10/08/2019   previous in [place] Left 06/02/2019   Procedure: LEFT TOTAL KNEE ARTHROPLASTY;  Surgeon: Leandrew Koyanagi, MD;  Location: [place];  Service: Orthopedics;  Laterality: Left;    TUBAL LIGATION     Social History   Occupational History   Not on file  Tobacco Use   Smoking status: Every Day    Packs/day: 0.50    Years: 15.00    Pack years: 7.50    Types: Cigarettes   Smokeless tobacco: Never  Vaping Use   Vaping Use: Never used  Substance and Sexual Activity   Alcohol use: Yes    Comment: occ beer   Drug use: Yes    Frequency: 2.0 times per week    Types: Cocaine, Marijuana    Comment: last used cocaine few days ago, marijuana last used 2-3 weeks ago   Sexual activity: Yes    Birth control/protection: Surgical

## 2021-04-06 NOTE — Progress Notes (Signed)
Subjective: Patient is here for ultrasound-guided intra-articular left hip injection.   Pain in hip and knee.  Objective:  Pain with IR.  Procedure: Ultrasound guided injection is preferred based studies that show increased duration, increased effect, greater accuracy, decreased procedural pain, increased response rate, and decreased cost with ultrasound guided versus blind injection.   Verbal informed consent obtained.  Time-out conducted.  Noted no overlying erythema, induration, or other signs of local infection. Ultrasound-guided left hip injection: After sterile prep with Betadine, injected 4 cc 0.25% bupivacaine without epinephrine and 6 mg betamethasone using a 22-gauge spinal needle, passing the needle through the iliofemoral ligament into the femoral head/neck junction.  Injectate seen filling joint capsule.  Good immediate relief.

## 2021-04-15 ENCOUNTER — Inpatient Hospital Stay: Admission: RE | Admit: 2021-04-15 | Payer: Medicare Other | Source: Ambulatory Visit

## 2021-04-15 ENCOUNTER — Other Ambulatory Visit: Payer: Self-pay | Admitting: Nurse Practitioner

## 2021-04-15 DIAGNOSIS — Z1231 Encounter for screening mammogram for malignant neoplasm of breast: Secondary | ICD-10-CM

## 2021-05-04 ENCOUNTER — Telehealth: Payer: Self-pay | Admitting: Nurse Practitioner

## 2021-05-04 NOTE — Telephone Encounter (Signed)
Quinnette NP from [employer]  [phone] A1C - 7.2 (patient is not diabetic)

## 2021-05-05 ENCOUNTER — Other Ambulatory Visit: Payer: Self-pay | Admitting: Nurse Practitioner

## 2021-05-05 MED ORDER — METFORMIN HCL 500 MG PO TABS
500.0000 mg | ORAL_TABLET | Freq: Every day | ORAL | 0 refills | Status: DC
Start: 2021-05-05 — End: 2021-11-10

## 2021-05-05 NOTE — Telephone Encounter (Signed)
Routing to PCP for review.  A1C7.2

## 2021-05-05 NOTE — Telephone Encounter (Signed)
A1c shows diabetes. Metformin has been sent to pharmacy to take once a day until your appointment in October.

## 2021-05-09 NOTE — Telephone Encounter (Signed)
Called pt made aware of NP message. Verbalized understanding

## 2021-05-12 ENCOUNTER — Other Ambulatory Visit: Payer: Self-pay | Admitting: Nurse Practitioner

## 2021-05-12 DIAGNOSIS — G8929 Other chronic pain: Secondary | ICD-10-CM

## 2021-05-12 NOTE — Telephone Encounter (Signed)
Requested medications are due for refill today.  no  Requested medications are on the active medications list.  no  Last refill. 02/14/2021  Future visit scheduled.   yes  Notes to clinic.  Medication was discontinued 03/28/2021.

## 2021-05-16 ENCOUNTER — Other Ambulatory Visit: Payer: Self-pay | Admitting: Nurse Practitioner

## 2021-05-16 DIAGNOSIS — E782 Mixed hyperlipidemia: Secondary | ICD-10-CM

## 2021-05-17 NOTE — Telephone Encounter (Signed)
Requested medications are due for refill today.  yes  Requested medications are on the active medications list.  no  Last refill. 11/17/2020  Future visit scheduled.   yes  Notes to clinic.  Medication was d/c'd, but then refilled on 11/17/2020. Please advise.

## 2021-06-07 ENCOUNTER — Ambulatory Visit: Payer: Medicare Other

## 2021-06-28 ENCOUNTER — Telehealth: Payer: Self-pay | Admitting: Nurse Practitioner

## 2021-06-28 ENCOUNTER — Ambulatory Visit: Payer: Medicare Other | Attending: Nurse Practitioner | Admitting: Nurse Practitioner

## 2021-06-28 ENCOUNTER — Other Ambulatory Visit: Payer: Self-pay

## 2021-06-28 NOTE — Telephone Encounter (Signed)
Unable to reach or LVM

## 2021-09-15 ENCOUNTER — Ambulatory Visit: Payer: Medicare Other | Admitting: Physician Assistant

## 2021-09-15 DIAGNOSIS — R7303 Prediabetes: Secondary | ICD-10-CM

## 2021-11-09 ENCOUNTER — Ambulatory Visit: Payer: Self-pay

## 2021-11-09 ENCOUNTER — Other Ambulatory Visit: Payer: Self-pay

## 2021-11-09 ENCOUNTER — Ambulatory Visit (INDEPENDENT_AMBULATORY_CARE_PROVIDER_SITE_OTHER): Payer: Medicare Other | Admitting: Orthopaedic Surgery

## 2021-11-09 ENCOUNTER — Encounter: Payer: Self-pay | Admitting: Physician Assistant

## 2021-11-09 DIAGNOSIS — Z96652 Presence of left artificial knee joint: Secondary | ICD-10-CM

## 2021-11-09 MED ORDER — TRAMADOL HCL 50 MG PO TABS: 50.0000 mg | ORAL_TABLET | Freq: Two times a day (BID) | ORAL | 2 refills | Status: DC | PRN

## 2021-11-09 NOTE — Progress Notes (Signed)
? ?Office Visit Note ?  ?Patient: Judy Martinez           ?Date of Birth: [birth year]           ?MRN: [id number] ?Visit Date: 11/09/2021 ?             ?Requested by: Gildardo Pounds, NP ?[address] ?[place],  [place] [address] ?PCP: Gildardo Pounds, NP ? ? ?Assessment & Plan: ?Visit Diagnoses:  ?1. Hx of total knee replacement, left   ? ? ?Plan: Impression is chronic left hip and knee pain referred from the left hip as she has underlying DJD.  We discussed repeat cortisone injection in addition to total hip arthroplasty.  She is interested in proceeding with cortisone injection at this point and will think about total hip arthroplasty.  Total hip replacement handout provided today.  Referral to Dr. Ernestina Patches has been made for the injection.  Follow-up with [place] as needed. ? ?Follow-Up Instructions: Return if symptoms worsen or fail to improve.  ? ?Orders:  ?Orders Placed This Encounter  ?Procedures  ? XR KNEE 3 VIEW LEFT  ? ?Meds ordered this encounter  ?Medications  ? traMADol (ULTRAM) 50 MG tablet  ?  Sig: Take 1 tablet (50 mg total) by mouth every 12 (twelve) hours as needed.  ?  Dispense:  30 tablet  ?  Refill:  2  ? ? ? ? Procedures: ?No procedures performed ? ? ?Clinical Data: ?No additional findings. ? ? ?Subjective: ?Chief Complaint  ?Patient presents with  ? Left Knee - Pain  ? ? ?HPI patient is a pleasant 71 year old female who comes in today with chronic left hip and knee pain.  She is status post left total knee replacement 06/02/2019.  She was seen by [place] this past summer with left knee pain.  It was thought at that time that this was likely referred from her hip.  She was referred to Dr. Junius Roads where intra-articular cortisone injection was performed.  She notes that this significantly helped, but only lasted for a little over a month.  Her pain has returned and has progressively worsened.  She is now ambulating with a cane.  The pain is not only to the groin but radiates down the anterior thigh and  into the knee.  Pain is constant but worse with walking and as well as when she is trying to sleep at night. ? ?Review of Systems as detailed in HPI.  All others reviewed and are negative. ? ? ?Objective: ?Vital Signs: LMP  (LMP Unknown)  ? ?Physical Exam well-developed well-nourished female no acute distress.  Alert and oriented x3. ? ?Ortho Exam left hip exam reveals a positive logroll and FADIR.  Left knee exam shows range of motion from 0 to 110 degrees.  No joint line tenderness.  No effusion.  She is stable to valgus varus stress.  She is neurovascular intact distally. ? ?Specialty Comments:  ?No specialty comments available. ? ?Imaging: ?XR KNEE 3 VIEW LEFT ? ?Result Date: 11/09/2021 ?Well-seated prosthesis without complication  ? ? ?PMFS History: ?Patient Active Problem List  ? Diagnosis Date Noted  ? Status post total knee replacement, left 06/17/2019  ? Status post total left knee replacement 06/02/2019  ? Primary osteoarthritis of left knee   ? Elevated liver enzymes 09/25/2018  ? Cocaine use 09/25/2018  ? Smoker unmotivated to quit 09/25/2018  ? Chronic bilateral low back pain with sciatica 08/28/2018  ? Chronic pain of left knee 08/28/2018  ?  Alcohol abuse   ? Chronic insomnia   ? Hyperlipidemia   ? Essential hypertension   ? History of CVA (cerebrovascular accident)   ? ?Past Medical History:  ?Diagnosis Date  ? Arthritis   ? Heart attack [facility]) 2004  ? Hypertension   ? Stroke [facility]) 2015  ?  ?Family History  ?Problem Relation Age of Onset  ? Alcohol abuse Father   ? Esophageal cancer Neg Hx   ? Colon cancer Neg Hx   ? Colon polyps Neg Hx   ? Rectal cancer Neg Hx   ? Stomach cancer Neg Hx   ?  ?Past Surgical History:  ?Procedure Laterality Date  ? ABDOMINAL HYSTERECTOMY    ? COLONOSCOPY  10/08/2019  ? previous in [place]  ? TOTAL KNEE ARTHROPLASTY Left 06/02/2019  ? Procedure: LEFT TOTAL KNEE ARTHROPLASTY;  Surgeon: Leandrew Koyanagi, MD;  Location: [place];  Service: Orthopedics;  Laterality: Left;  ? TUBAL LIGATION     ? ?Social History  ? ?Occupational History  ? Not on file  ?Tobacco Use  ? Smoking status: Every Day  ?  Packs/day: 0.50  ?  Years: 15.00  ?  Pack years: 7.50  ?  Types: Cigarettes  ? Smokeless tobacco: Never  ?Vaping Use  ? Vaping Use: Never used  ?Substance and Sexual Activity  ? Alcohol use: Yes  ?  Comment: occ beer  ? Drug use: Yes  ?  Frequency: 2.0 times per week  ?  Types: Cocaine, Marijuana  ?  Comment: last used cocaine few days ago, marijuana last used 2-3 weeks ago  ? Sexual activity: Yes  ?  Birth control/protection: Surgical  ? ? ? ? ? ? ?

## 2021-11-10 ENCOUNTER — Other Ambulatory Visit: Payer: Self-pay | Admitting: Family Medicine

## 2021-11-10 ENCOUNTER — Telehealth: Payer: Self-pay | Admitting: *Deleted

## 2021-11-10 DIAGNOSIS — I10 Essential (primary) hypertension: Secondary | ICD-10-CM

## 2021-11-10 MED ORDER — METFORMIN HCL 500 MG PO TABS: 500.0000 mg | ORAL_TABLET | Freq: Every day | ORAL | 0 refills | Status: DC

## 2021-11-10 MED ORDER — LISINOPRIL 10 MG PO TABS: ORAL_TABLET | ORAL | 0 refills | Status: DC

## 2021-11-10 NOTE — Telephone Encounter (Signed)
[employer] NP calling to report home visit- ? ?POC A1c-12.3 ? ?Patient is out of medication- lisinopril and metformin for over 1 month and is requesting RF. Patient has transportation problems and would like Rx sent to [facility]- they are able to walk there to get Rx. ?Advised overdue follow up- and was scheduled for 3/23- first available. Patient is requesting RF of her medications until she can be seen.  ?

## 2021-11-10 NOTE — Telephone Encounter (Signed)
Patient request call back regarding medications- so her husband can go and get them ?

## 2021-11-10 NOTE — Addendum Note (Signed)
Addended by: Daisy Blossom, Annie Main L on: 11/10/2021 01:34 PM ? ? Modules accepted: Orders ? ?

## 2021-11-10 NOTE — Telephone Encounter (Signed)
Receipt confirmed by pharmacy [id number] 11/10/21 ? ?Requested Prescriptions  ?Pending Prescriptions Disp Refills  ?? lisinopril (ZESTRIL) 10 MG tablet [Pharmacy Med Name: LISINOPRIL 10MG TABLETS] 90 tablet   ?  Sig: TAKE 1 TABLET(10 MG) BY MOUTH DAILY FOR BLOOD PRESSURE  ?  ? Cardiovascular:  ACE Inhibitors Failed - 11/10/2021  1:38 PM  ?  ?  Failed - Cr in normal range and within 180 days  ?  Creatinine, Ser  ?Date Value Ref Range Status  ?03/28/2021 0.91 0.57 - 1.00 mg/dL Final  ?   ?  ?  Failed - K in normal range and within 180 days  ?  Potassium  ?Date Value Ref Range Status  ?03/28/2021 4.0 3.5 - 5.2 mmol/L Final  ?   ?  ?  Failed - Last BP in normal range  ?  BP Readings from Last 1 Encounters:  ?03/28/21 (!) 149/85  ?   ?  ?  Passed - Patient is not pregnant  ?  ?  Passed - Valid encounter within last 6 months  ?  Recent Outpatient Visits   ?      ? 7 months ago Encounter for annual wellness exam in Medicare patient  ? [place], [place] W, NP  ? 11 months ago Essential hypertension  ? [place] Gildardo Pounds, NP  ? 2 years ago Encounter for annual physical exam  ? [place], [place] W, NP  ? 2 years ago Essential hypertension  ? [place], [place] W, NP  ? 2 years ago Essential hypertension  ? [place] Gildardo Pounds, NP  ?  ?  ?Future Appointments   ?        ? In 3 weeks Thereasa Solo, [place]  ? In 4 months Leandrew Koyanagi, MD [place]  ?  ? ?  ?  ?  ?? metFORMIN (GLUCOPHAGE) 500 MG tablet [Pharmacy Med Name: METFORMIN 500MG TABLETS] 90 tablet   ?  Sig: TAKE 1 TABLET(500 MG) BY MOUTH DAILY WITH BREAKFAST  ?  ? Endocrinology:  Diabetes - Biguanides Failed - 11/10/2021  1:38 PM  ?  ?  Failed - HBA1C is between 0 and 7.9 and within 180 days  ?  Hgb A1c MFr Bld  ?Date Value Ref  Range Status  ?11/17/2020 6.4 (H) 4.8 - 5.6 % Final  ?  Comment:  ?           Prediabetes: 5.7 - 6.4 ?         Diabetes: >6.4 ?         Glycemic control for adults with diabetes: <7.0 ?  ?   ?  ?  Failed - B12 Level in normal range and within 720 days  ?  Vitamin B-12  ?Date Value Ref Range Status  ?08/27/2018 362 232 - 1,245 pg/mL Final  ?   ?  ?  Failed - CBC within normal limits and completed in the last 12 months  ?  WBC  ?Date Value Ref Range Status  ?11/17/2020 7.8 3.4 - 10.8 x10E3/uL Final  ?06/03/2019 8.9 4.0 - 10.5 K/uL Final  ? ?RBC  ?Date Value Ref Range Status  ?11/17/2020 3.95 3.77 - 5.28 x10E6/uL Final  ?06/03/2019 3.04 (L) 3.87 - 5.11 MIL/uL Final  ? ?Hemoglobin  ?  Date Value Ref Range Status  ?11/17/2020 12.4 11.1 - 15.9 g/dL Final  ? ?Hematocrit  ?Date Value Ref Range Status  ?11/17/2020 37.5 34.0 - 46.6 % Final  ? ?MCHC  ?Date Value Ref Range Status  ?11/17/2020 33.1 31.5 - 35.7 g/dL Final  ?06/03/2019 31.6 30.0 - 36.0 g/dL Final  ? ?MCH  ?Date Value Ref Range Status  ?11/17/2020 31.4 26.6 - 33.0 pg Final  ?06/03/2019 31.6 26.0 - 34.0 pg Final  ? ?MCV  ?Date Value Ref Range Status  ?11/17/2020 95 79 - 97 fL Final  ? ?No results found for: PLTCOUNTKUC, LABPLAT, [place] ?RDW  ?Date Value Ref Range Status  ?11/17/2020 13.0 11.7 - 15.4 % Final  ? ?  ?  ?  Passed - Cr in normal range and within 360 days  ?  Creatinine, Ser  ?Date Value Ref Range Status  ?03/28/2021 0.91 0.57 - 1.00 mg/dL Final  ?   ?  ?  Passed - eGFR in normal range and within 360 days  ?  GFR calc Af Amer  ?Date Value Ref Range Status  ?09/08/2019 62 >59 mL/min/1.73 Final  ? ?GFR calc non Af Amer  ?Date Value Ref Range Status  ?09/08/2019 54 (L) >59 mL/min/1.73 Final  ? ?eGFR  ?Date Value Ref Range Status  ?03/28/2021 68 >59 mL/min/1.73 Final  ?   ?  ?  Passed - Valid encounter within last 6 months  ?  Recent Outpatient Visits   ?      ? 7 months ago Encounter for annual wellness exam in Medicare patient  ? [place], [place] W, NP  ? 11 months ago Essential hypertension  ? [place] Gildardo Pounds, NP  ? 2 years ago Encounter for annual physical exam  ? [place], [place] W, NP  ? 2 years ago Essential hypertension  ? [place], [place] W, NP  ? 2 years ago Essential hypertension  ? [place] Gildardo Pounds, NP  ?  ?  ?Future Appointments   ?        ? In 3 weeks Thereasa Solo, [place]  ? In 4 months Leandrew Koyanagi, MD [place]  ?  ? ?  ?  ?  ? ? ?

## 2021-11-10 NOTE — Telephone Encounter (Signed)
Rx sent. Pt called and informed.  ?

## 2021-11-21 ENCOUNTER — Telehealth: Payer: Self-pay | Admitting: Orthopaedic Surgery

## 2021-11-21 ENCOUNTER — Other Ambulatory Visit: Payer: Self-pay

## 2021-11-21 DIAGNOSIS — M1612 Unilateral primary osteoarthritis, left hip: Secondary | ICD-10-CM

## 2021-11-21 NOTE — Telephone Encounter (Signed)
Pt stated Dr. Erlinda Hong was going to send a referral to Dr. Ernestina Patches for injection. Please call pt about this matter at [phone]. ?

## 2021-11-21 NOTE — Telephone Encounter (Signed)
Sent referral to [facility]  ?

## 2021-11-23 ENCOUNTER — Telehealth: Payer: Self-pay

## 2021-11-23 NOTE — Telephone Encounter (Signed)
Patient called and would like to go ahead and set up an apt with Dr. Ernestina Patches  ?

## 2021-11-24 ENCOUNTER — Telehealth: Payer: Self-pay | Admitting: Physical Medicine and Rehabilitation

## 2021-11-24 NOTE — Telephone Encounter (Signed)
Pt returned call to [facility] to set an appt. Please call pt at [phone]. ?

## 2021-12-07 ENCOUNTER — Encounter: Payer: Self-pay | Admitting: Physician Assistant

## 2021-12-07 ENCOUNTER — Ambulatory Visit
Admission: RE | Admit: 2021-12-07 | Discharge: 2021-12-07 | Disposition: A | Discharge status: Home / Self Care | Payer: Medicare Other | Source: Ambulatory Visit | Attending: Physician Assistant | Admitting: Physician Assistant

## 2021-12-07 ENCOUNTER — Ambulatory Visit: Payer: Medicare Other | Attending: Physician Assistant | Admitting: Physician Assistant

## 2021-12-07 ENCOUNTER — Other Ambulatory Visit: Payer: Self-pay

## 2021-12-07 VITALS — BP 113/77 | HR 92 | Resp 16 | Wt 155.2 lb

## 2021-12-07 DIAGNOSIS — F5104 Psychophysiologic insomnia: Secondary | ICD-10-CM

## 2021-12-07 DIAGNOSIS — S0003XA Contusion of scalp, initial encounter: Secondary | ICD-10-CM

## 2021-12-07 DIAGNOSIS — S0990XA Unspecified injury of head, initial encounter: Secondary | ICD-10-CM | POA: Diagnosis not present

## 2021-12-07 DIAGNOSIS — E559 Vitamin D deficiency, unspecified: Secondary | ICD-10-CM | POA: Diagnosis not present

## 2021-12-07 DIAGNOSIS — W19XXXA Unspecified fall, initial encounter: Secondary | ICD-10-CM

## 2021-12-07 DIAGNOSIS — E1165 Type 2 diabetes mellitus with hyperglycemia: Secondary | ICD-10-CM

## 2021-12-07 DIAGNOSIS — I10 Essential (primary) hypertension: Secondary | ICD-10-CM | POA: Diagnosis not present

## 2021-12-07 DIAGNOSIS — E782 Mixed hyperlipidemia: Secondary | ICD-10-CM | POA: Diagnosis not present

## 2021-12-07 DIAGNOSIS — Z1231 Encounter for screening mammogram for malignant neoplasm of breast: Secondary | ICD-10-CM

## 2021-12-07 LAB — POCT GLYCOSYLATED HEMOGLOBIN (HGB A1C): HbA1c, POC (controlled diabetic range): 11.9 % — AB (ref 0.0–7.0)

## 2021-12-07 LAB — GLUCOSE, POCT (MANUAL RESULT ENTRY): POC Glucose: 248 mg/dl — AB (ref 70–99)

## 2021-12-07 MED ORDER — METFORMIN HCL 500 MG PO TABS: 1000.0000 mg | ORAL_TABLET | Freq: Two times a day (BID) | ORAL | 0 refills | Status: DC

## 2021-12-07 MED ORDER — LISINOPRIL 10 MG PO TABS: ORAL_TABLET | ORAL | 0 refills | Status: DC

## 2021-12-07 MED ORDER — ACCU-CHEK SOFTCLIX LANCETS MISC: 12 refills | Status: DC

## 2021-12-07 MED ORDER — ACCU-CHEK GUIDE VI STRP: ORAL_STRIP | 12 refills | Status: DC

## 2021-12-07 MED ORDER — QUETIAPINE FUMARATE 50 MG PO TABS: 50.0000 mg | ORAL_TABLET | Freq: Every day | ORAL | 1 refills | Status: DC

## 2021-12-07 MED ORDER — ATORVASTATIN CALCIUM 40 MG PO TABS: 40.0000 mg | ORAL_TABLET | Freq: Every day | ORAL | 3 refills | Status: DC

## 2021-12-07 MED ORDER — ACCU-CHEK GUIDE ME W/DEVICE KIT: 1.0000 | PACK | Freq: Two times a day (BID) | 0 refills | Status: DC

## 2021-12-07 NOTE — Progress Notes (Signed)
Patient ID: Judy Martinez, female   DOB: June 03, 1951, 71 y.o.   MRN: [id number] ? ? ?Judy Martinez, is a 71 y.o. female ? ?[id number] ? ?[id number] ? ?DOB - December 02, 1950 ? ?Chief Complaint  ?Patient presents with  ? Hypertension  ? Prediabetes  ?    ? ?Subjective:  ? ?Judy Martinez is a 71 y.o. female here today for med RF.  She also mentions that as she was sweeping house 2 days ago, she "fell out."  She denies any CP/SOB/palpitations.  She did hit her forehead on something when she fell.  No vision changes.  No memory loss and and no residual effects or weakness.  She thinks she lost consciousness for a second but unclear.  She admits to poor water intake.  She has otherwise been fine and taking her medications including metformin once daily 538m.  She does not have a glucometer.   ? ? ?No problems updated. ? ?ALLERGIES: ?Allergies  ?Allergen Reactions  ? Penicillins Anaphylaxis and Hives  ?  Did it involve swelling of the face/tongue/throat, SOB, or low BP? Yes ?Did it involve sudden or severe rash/hives, skin peeling, or any reaction on the inside of your mouth or nose? No ?Did you need to seek medical attention at a hospital or doctor's office? Yes ?When did it last happen?    Over 10 years ago   ?If all above answers are "NO", may proceed with cephalosporin use. ?  ? ? ?PAST MEDICAL HISTORY: ?Past Medical History:  ?Diagnosis Date  ? Arthritis   ? Heart attack ([facility] 2004  ? Hypertension   ? Stroke ([facility] 2015  ? ? ?MEDICATIONS AT HOME: ?Prior to Admission medications   ?Medication Sig Start Date End Date Taking? Authorizing Provider  ?Accu-Chek Softclix Lancets lancets Use as instructed 12/07/21   MArgentina Donovan PA-C  ?albuterol (VENTOLIN HFA) 108 (90 Base) MCG/ACT inhaler INHALE 2 PUFFS INTO THE LUNGS EVERY 6 HOURS AS NEEDED FOR WHEEZING OR SHORTNESS OF BREATH ?Patient not taking: Reported on 03/28/2021 11/17/20   FGildardo Pounds NP  ?atorvastatin (LIPITOR) 40 MG tablet Take 1 tablet (40 mg total) by  mouth daily. 12/07/21   MArgentina Donovan PA-C  ?Blood Glucose Monitoring Suppl (ACCU-CHEK GUIDE ME) w/Device KIT 1 each by Does not apply route 2 (two) times daily. 12/07/21   MArgentina Donovan PA-C  ?glucose blood (ACCU-CHEK GUIDE) test strip Use as instructed 12/07/21   MArgentina Donovan PA-C  ?lisinopril (ZESTRIL) 10 MG tablet TAKE 1 TABLET(10 MG) BY MOUTH DAILY. FOR BLOOD PRESSURE 12/07/21   MArgentina Donovan PA-C  ?metFORMIN (GLUCOPHAGE) 500 MG tablet Take 2 tablets (1,000 mg total) by mouth 2 (two) times daily with a meal. 12/07/21 03/07/22  MArgentina Donovan PA-C  ?QUEtiapine (SEROQUEL) 50 MG tablet Take 1 tablet (50 mg total) by mouth at bedtime. 12/07/21 03/07/22  MArgentina Donovan PA-C  ?traMADol (ULTRAM) 50 MG tablet Take 1 tablet (50 mg total) by mouth every 12 (twelve) hours as needed. 11/09/21   SAundra Dubin PA-C  ?vitamin B-12 (CYANOCOBALAMIN) 1000 MCG tablet Take 1,000 mcg by mouth every 7 (seven) days.    [provider]  ?Vitamin D, Ergocalciferol, (DRISDOL) 1.25 MG (50000 UNIT) CAPS capsule TAKE 1 CAPSULE BY MOUTH 1 TIME A WEEK ?Patient not taking: Reported on 03/28/2021 11/20/20   FGildardo Pounds NP  ? ? ?ROS: ?Neg resp ?Neg cardiac ?Neg GI ?Neg GU ?Neg MS ?Neg psych ? ?Objective:  ? ?Vitals:  ?  12/07/21 0843  ?BP: 113/77  ?Pulse: 92  ?Resp: 16  ?SpO2: 96%  ?Weight: 155 lb 3.2 oz (70.4 kg)  ? ?Exam ?General appearance : Awake, alert, not in any distress. Speech Clear. Not toxic looking ?HEENT: Normocephalic, there is an abrasion on her R forehead with minimal swelling surrounding it.  pupils equally reactive to light and accomodation,  EOM intact.  She does appear to have B cataracts.  Fundi benign ?Neck: Supple, no JVD. No cervical lymphadenopathy.  ?Chest: Good air entry bilaterally, CTAB.  No rales/rhonchi/wheezing ?CVS: S1 S2 regular, no murmurs.  ?Extremities: B/L Lower Ext shows no edema, both legs are warm to touch ?Neurology: Awake alert, and oriented X 3, CN II-XII intact, Non  focal ?Skin: No Rash ? ?Data Review ?Lab Results  ?Component Value Date  ? HGBA1C 11.9 (A) 12/07/2021  ? HGBA1C 6.4 (H) 11/17/2020  ? HGBA1C 6.0 (H) 09/08/2019  ? ? ?Assessment & Plan  ? ?1. Type 2 diabetes mellitus with hyperglycemia, unspecified whether long term insulin use ([place]) ?New diagnosis; last A1C=6.4.  check blood sugars fasting and bed time and will increase metformin to 2g daily then assess in 3 weeks ?- Glucose (CBG) ?- HgB A1c ?- CBC with Differential/Platelet ?- Ambulatory referral to Ophthalmology ?- metFORMIN (GLUCOPHAGE) 500 MG tablet; Take 2 tablets (1,000 mg total) by mouth 2 (two) times daily with a meal.  Dispense: 360 tablet; Refill: 0 ?- glucose blood (ACCU-CHEK GUIDE) test strip; Use as instructed  Dispense: 100 each; Refill: 12 ?- Blood Glucose Monitoring Suppl (ACCU-CHEK GUIDE ME) w/Device KIT; 1 each by Does not apply route 2 (two) times daily.  Dispense: 1 kit; Refill: 0 ?- Accu-Chek Softclix Lancets lancets; Use as instructed  Dispense: 100 each; Refill: 12 ? ?2. Essential hypertension ?Controlled-continue ?- Comprehensive metabolic panel ?- CBC with Differential/Platelet ?- lisinopril (ZESTRIL) 10 MG tablet; TAKE 1 TABLET(10 MG) BY MOUTH DAILY. FOR BLOOD PRESSURE  Dispense: 90 tablet; Refill: 0 ? ?3. Mixed hyperlipidemia ?She has been out of atorvastatin ?- Comprehensive metabolic panel ?- Lipid panel ?- atorvastatin (LIPITOR) 40 MG tablet; Take 1 tablet (40 mg total) by mouth daily.  Dispense: 90 tablet; Refill: 3 ? ?4. Vitamin D deficiency disease ?- Vitamin D, 25-hydroxy ? ?5. Fall, initial encounter ?No red flags on exam ?- CT HEAD WO CONTRAST (5MM); Future ? ?6. Contusion of scalp, initial encounter ?No red flags on exam ?- CT HEAD WO CONTRAST (5MM); Future ? ?7. Chronic insomnia ?- QUEtiapine (SEROQUEL) 50 MG tablet; Take 1 tablet (50 mg total) by mouth at bedtime.  Dispense: 90 tablet; Refill: 1 ? ?8. Encounter for screening mammogram for malignant neoplasm of breast ?- MM  Digital Screening; Future ? ? ? ?Patient have been counseled extensively about nutrition and exercise. Other issues discussed during this visit include: low cholesterol diet, weight control and daily exercise, foot care, annual eye examinations at Ophthalmology, importance of adherence with medications and regular follow-up. We also discussed long term complications of uncontrolled diabetes and hypertension.  ? ?Return for 3 weeks with Lurena Joiner;  3 months with PCP. ? ?The patient was given clear instructions to go to ER or return to medical center if symptoms don't improve, worsen or new problems develop. The patient verbalized understanding. The patient was told to call to get lab results if they haven't heard anything in the next week.  ? ? ? ? ?Freeman Caldron, PA-C ?[place] ?[place], [place] ?[phone]   ?12/07/2021, 9:31 AM  ?

## 2021-12-07 NOTE — Patient Instructions (Addendum)
Check blood sugars fasting and at bedtime and record.  Bring readings to next visit.   ? ?Drink 80-100 ounces water daily ?

## 2021-12-08 ENCOUNTER — Telehealth: Payer: Self-pay

## 2021-12-08 ENCOUNTER — Other Ambulatory Visit: Payer: Self-pay | Admitting: Physician Assistant

## 2021-12-08 DIAGNOSIS — E559 Vitamin D deficiency, unspecified: Secondary | ICD-10-CM

## 2021-12-08 LAB — CBC WITH DIFFERENTIAL/PLATELET
Basophils Absolute: 0 10*3/uL (ref 0.0–0.2)
Basos: 1 %
EOS (ABSOLUTE): 0.2 10*3/uL (ref 0.0–0.4)
Eos: 5 %
Hematocrit: 36.8 % (ref 34.0–46.6)
Hemoglobin: 12.2 g/dL (ref 11.1–15.9)
Immature Grans (Abs): 0 10*3/uL (ref 0.0–0.1)
Immature Granulocytes: 0 %
Lymphocytes Absolute: 1.8 10*3/uL (ref 0.7–3.1)
Lymphs: 34 %
MCH: 29.8 pg (ref 26.6–33.0)
MCHC: 33.2 g/dL (ref 31.5–35.7)
MCV: 90 fL (ref 79–97)
Monocytes Absolute: 0.3 10*3/uL (ref 0.1–0.9)
Monocytes: 5 %
Neutrophils Absolute: 3 10*3/uL (ref 1.4–7.0)
Neutrophils: 55 %
Platelets: 326 10*3/uL (ref 150–450)
RBC: 4.1 x10E6/uL (ref 3.77–5.28)
RDW: 12.7 % (ref 11.7–15.4)
WBC: 5.3 10*3/uL (ref 3.4–10.8)

## 2021-12-08 LAB — COMPREHENSIVE METABOLIC PANEL
ALT: 17 IU/L (ref 0–32)
AST: 18 IU/L (ref 0–40)
Albumin/Globulin Ratio: 1.6 (ref 1.2–2.2)
Albumin: 4.2 g/dL (ref 3.8–4.8)
Alkaline Phosphatase: 97 IU/L (ref 44–121)
BUN/Creatinine Ratio: 12 (ref 12–28)
BUN: 12 mg/dL (ref 8–27)
Bilirubin Total: 0.2 mg/dL (ref 0.0–1.2)
CO2: 18 mmol/L — ABNORMAL LOW (ref 20–29)
Calcium: 10 mg/dL (ref 8.7–10.3)
Chloride: 105 mmol/L (ref 96–106)
Creatinine, Ser: 1 mg/dL (ref 0.57–1.00)
Globulin, Total: 2.6 g/dL (ref 1.5–4.5)
Glucose: 267 mg/dL — ABNORMAL HIGH (ref 70–99)
Potassium: 3.8 mmol/L (ref 3.5–5.2)
Sodium: 142 mmol/L (ref 134–144)
Total Protein: 6.8 g/dL (ref 6.0–8.5)
eGFR: 61 mL/min/{1.73_m2} (ref >59)

## 2021-12-08 LAB — LIPID PANEL
Chol/HDL Ratio: 2.4 ratio (ref 0.0–4.4)
Cholesterol, Total: 133 mg/dL (ref 100–199)
HDL: 56 mg/dL (ref >39)
LDL Chol Calc (NIH): 61 mg/dL (ref 0–99)
Triglycerides: 85 mg/dL (ref 0–149)
VLDL Cholesterol Cal: 16 mg/dL (ref 5–40)

## 2021-12-08 LAB — VITAMIN D 25 HYDROXY (VIT D DEFICIENCY, FRACTURES): Vit D, 25-Hydroxy: 19.9 ng/mL — ABNORMAL LOW (ref 30.0–100.0)

## 2021-12-08 MED ORDER — VITAMIN D (ERGOCALCIFEROL) 1.25 MG (50000 UNIT) PO CAPS: ORAL_CAPSULE | ORAL | 0 refills | Status: DC

## 2021-12-08 NOTE — Telephone Encounter (Signed)
Pt was called and vm was left, Information has been sent to nurse pool.  Letter will be sent out. ?

## 2021-12-13 ENCOUNTER — Ambulatory Visit: Payer: Self-pay

## 2021-12-13 ENCOUNTER — Ambulatory Visit (INDEPENDENT_AMBULATORY_CARE_PROVIDER_SITE_OTHER): Payer: Medicare Other | Admitting: Physical Medicine and Rehabilitation

## 2021-12-13 ENCOUNTER — Encounter: Payer: Self-pay | Admitting: Physical Medicine and Rehabilitation

## 2021-12-13 DIAGNOSIS — M25552 Pain in left hip: Secondary | ICD-10-CM | POA: Diagnosis not present

## 2021-12-13 NOTE — Progress Notes (Signed)
Pt state left hip pain. Pt state walking makes the pain worse. Pt state she takes pain med to help ease her pain. ? ?Numeric Pain Rating Scale and Functional Assessment ?Average Pain 5 ? ? ?In the last MONTH (on 0-10 scale) has pain interfered with the following? ? ?1. General activity like being  able to carry out your everyday physical activities such as walking, climbing stairs, carrying groceries, or moving a chair?  ?Rating(10) ? ? ?-BT, -Dye Allergies. ? ?

## 2021-12-22 NOTE — Progress Notes (Signed)
Called pt unable to reach left VM to call back ?

## 2021-12-29 ENCOUNTER — Ambulatory Visit: Payer: Medicare Other | Attending: Nurse Practitioner | Admitting: Pharmacist

## 2021-12-29 ENCOUNTER — Encounter: Payer: Self-pay | Admitting: Pharmacist

## 2021-12-29 DIAGNOSIS — E1165 Type 2 diabetes mellitus with hyperglycemia: Secondary | ICD-10-CM | POA: Diagnosis not present

## 2021-12-29 MED ORDER — METFORMIN HCL ER 500 MG PO TB24: 1000.0000 mg | ORAL_TABLET | Freq: Two times a day (BID) | ORAL | 1 refills | Status: DC

## 2021-12-29 NOTE — Progress Notes (Signed)
? ? ?  S:    ? ?No chief complaint on file. ? ?Judy Martinez is a 71 y.o. female who presents for diabetes evaluation, education, and management. PMH is significant for HTN, chronic low back pain with sciatica, OA of L knee, T2DM, alcohol abuse, stroke, chronic insomnia. Patient was referred and last seen by Freeman Caldron, on 12/07/2021. A1c at that visit was 11.9 (up from 6.4). Her metformin was increased to 1000 mg BID.  ? ?Today, patient arrives in good spirits and presents without assistance. ? ?Patient reports Diabetes was diagnosed in March. She was dx with preDM previously. Since increasing her metformin dose, she endorses daily diarrhea. She takes her metformin after meals. Denies ever using insulin or other oral antihyperglycemics.  ? ?Family/Social History:  ?-Fhx: alcohol abuse ?-Tobacco: 0.5 PPD smoker  ?-Alcohol: none reported ? ?Current diabetes medications include: metformin 1000 mg BID (takes two 500 mg tablets BID) ?Current hypertension medications include: lisinopril 10 mg daily  ?Current hyperlipidemia medications include: atorvastatin 40 mg daily  ? ?Insurance coverage: [employer] ? ?Patient denies hypoglycemic events. ? ?Reported home fasting blood sugars: 120s-160s  ? ?Patient denies nocturia (nighttime urination).  ?Patient denies neuropathy (nerve pain). ?Patient denies visual changes. ?Patient reports self foot exams.  ? ?Patient reported dietary habits:  ?-Pt reports decreasing portion sizes  ?- Admits to having a sweet tooth but is trying to decrease the amount of sweets she eats daily  ? ?Patient-reported exercise habits:  ?-Walks her dog daily ~5-10 minutes  ? ? ?O:  ?Lab Results  ?Component Value Date  ? HGBA1C 11.9 (A) 12/07/2021  ? ?There were no vitals filed for this visit. ? ?Lipid Panel  ?   ?Component Value Date/Time  ? CHOL 133 12/07/2021 0929  ? TRIG 85 12/07/2021 0929  ? HDL 56 12/07/2021 0929  ? CHOLHDL 2.4 12/07/2021 0929  ? [place] 61 12/07/2021 0929   ? ? ?Clinical Atherosclerotic Cardiovascular Disease (ASCVD): No  ?The 10-year ASCVD risk score (Arnett DK, et al., 2019) is: 27.8% ?  Values used to calculate the score: ?    Age: 41 years ?    Sex: Female ?    Is Non-Hispanic African American: Yes ?    Diabetic: Yes ?    Tobacco smoker: Yes ?    Systolic Blood Pressure: 127 mmHg ?    Is BP treated: Yes ?    HDL Cholesterol: 56 mg/dL ?    Total Cholesterol: 133 mg/dL  ? ? ?A/P: ?Diabetes recently dx currently uncontrolled based on A1c but home blood sugars are close to goal. Patient is able to verbalize appropriate hypoglycemia management plan. Medication adherence appears appropriate, however, she is endorsing side effects with regular release metformin. Will change to XR and reassess in 1 month. ?- Change  metformin to XR. Pt instructed to take two 500 mg XR tablets ('1000mg'$  total) BID.   ?-Patient educated on purpose, proper use, and potential adverse effects of metformin XR.  ?-Extensively discussed pathophysiology of diabetes, recommended lifestyle interventions, dietary effects on blood sugar control.  ?-Counseled on s/sx of and management of hypoglycemia.  ?-Next A1c anticipated 02/2022.  ? ?Written patient instructions provided. Patient verbalized understanding of treatment plan. Total time in face to face counseling 20 minutes.   ? ?Follow up pharmacist clinic visit in 4 weeks. ? ?Benard Halsted, PharmD, BCACP, CPP ?Clinical Pharmacist ?[place] ?[phone] ? ? ?

## 2022-01-03 MED ORDER — TRIAMCINOLONE ACETONIDE 40 MG/ML IJ SUSP
60.0000 mg | INTRAMUSCULAR | Status: AC | PRN
Start: 2021-12-13 — End: 2021-12-13
  Administered 2021-12-13: 60 mg via INTRA_ARTICULAR

## 2022-01-03 MED ORDER — BUPIVACAINE HCL 0.25 % IJ SOLN
4.0000 mL | INTRAMUSCULAR | Status: AC | PRN
  Administered 2021-12-13: 4 mL via INTRA_ARTICULAR

## 2022-01-03 NOTE — Progress Notes (Signed)
? ?  Judy Martinez - 71 y.o. female MRN [id number]  Date of birth: August 25, 1951 ? ?Office Visit Note: ?Visit Date: 12/13/2021 ?PCP: Gildardo Pounds, NP ?Referred by: Gildardo Pounds, NP ? ?Subjective: ?Chief Complaint  ?Patient presents with  ? Left Hip - Pain  ? ?HPI:  Judy Martinez is a 71 y.o. female who comes in today at the request of Dr. Eduard Roux for planned Left anesthetic hip arthrogram with fluoroscopic guidance.  The patient has failed conservative care including home exercise, medications, time and activity modification.  This injection will be diagnostic and hopefully therapeutic.  Please see requesting physician notes for further details and justification. ? ?ROS Otherwise per HPI. ? ?Assessment & Plan: ?Visit Diagnoses:  ?  ICD-10-CM   ?1. Pain in left hip  M25.552 XR C-ARM NO REPORT  ?  ?  ?Plan: No additional findings.  ? ?Meds & Orders: No orders of the defined types were placed in this encounter. ?  ?Orders Placed This Encounter  ?Procedures  ? Large Joint Inj  ? XR C-ARM NO REPORT  ?  ?Follow-up: Return if symptoms worsen or fail to improve.  ? ?Procedures: ?Large Joint Inj: L hip joint on 12/13/2021 3:15 PM ?Indications: diagnostic evaluation and pain ?Details: 22 G 3.5 in needle, fluoroscopy-guided anterior approach ? ?Arthrogram: No ? ?Medications: 4 mL bupivacaine 0.25 %; 60 mg triamcinolone acetonide 40 MG/ML ?Outcome: tolerated well, no immediate complications ? ?There was excellent flow of contrast producing a partial arthrogram of the hip. The patient did have relief of symptoms during the anesthetic phase of the injection. ?Procedure, treatment alternatives, risks and benefits explained, specific risks discussed. Consent was given by the patient. Immediately prior to procedure a time out was called to verify the correct patient, procedure, equipment, support staff and site/side marked as required. Patient was prepped and draped in the usual sterile fashion.  ? ?  ?   ? ?Clinical  History: ?No specialty comments available.  ? ? ? ?Objective:  VS:  HT:    WT:   BMI:     BP:   HR: bpm  TEMP: ( )  RESP:  ?Physical Exam  ? ?Imaging: ?No results found. ?

## 2022-01-25 ENCOUNTER — Other Ambulatory Visit: Payer: Self-pay | Admitting: Nurse Practitioner

## 2022-01-25 DIAGNOSIS — E782 Mixed hyperlipidemia: Secondary | ICD-10-CM

## 2022-01-26 ENCOUNTER — Ambulatory Visit: Payer: Medicare Other | Attending: Nurse Practitioner | Admitting: Pharmacist

## 2022-01-26 ENCOUNTER — Other Ambulatory Visit: Payer: Self-pay | Admitting: Physician Assistant

## 2022-01-26 DIAGNOSIS — E1165 Type 2 diabetes mellitus with hyperglycemia: Secondary | ICD-10-CM | POA: Diagnosis not present

## 2022-01-26 DIAGNOSIS — Z1231 Encounter for screening mammogram for malignant neoplasm of breast: Secondary | ICD-10-CM

## 2022-01-26 NOTE — Progress Notes (Signed)
S:     No chief complaint on file.  Judy Martinez is a 71 y.o. female who presents for diabetes evaluation, education, and management. PMH is significant for HTN, chronic low back pain with sciatica, OA of L knee, T2DM, alcohol abuse, stroke, chronic insomnia. Patient was referred and last seen by Freeman Caldron, on 12/07/2021. A1c at that visit was 11.9 (up from 6.4). I saw her 12/29/21 and changed her metformin to XR d/t GI side effects with the former.   Today, patient arrives in good spirits and presents without assistance. Unfortunately, she was unable to pick-up the metformin. She has been taking regular release metformin but only 1000 mg (two '500mg'$  tabs) in the morning. She does not take it in the evening. She tells me this has completely resolved the GI side effects she was endorsing at her last visit with me.   Family/Social History:  -Fhx: alcohol abuse -Tobacco: 0.5 PPD smoker  -Alcohol: none reported  Current diabetes medications include: metformin 500 mg XR - takes two 500 mg XR tablets BID (not taking - taking 1000 mg of regular release metformin in the mornings only)  Insurance coverage: [employer]  Patient denies hypoglycemic events.  Reported home fasting blood sugars:  None reported  Does not have her meter today   Patient denies nocturia (nighttime urination).  Patient denies neuropathy (nerve pain). Patient denies visual changes. Patient reports self foot exams.   Patient reported dietary habits:  -Pt reports decreasing portion sizes  - Admits to having a sweet tooth but is trying to decrease the amount of sweets she eats daily   Patient-reported exercise habits:  -Walks her dog daily ~5-10 minutes    O:  Lab Results  Component Value Date   HGBA1C 11.9 (A) 12/07/2021   There were no vitals filed for this visit.  Lipid Panel     Component Value Date/Time   CHOL 133 12/07/2021 0929   TRIG 85 12/07/2021 0929   HDL 56 12/07/2021 0929    CHOLHDL 2.4 12/07/2021 0929   LDLCALC 61 12/07/2021 0929    Clinical Atherosclerotic Cardiovascular Disease (ASCVD): No  The 10-year ASCVD risk score (Arnett DK, et al., 2019) is: 27.8%   Values used to calculate the score:     Age: 14 years     Sex: Female     Is Non-Hispanic African American: Yes     Diabetic: Yes     Tobacco smoker: Yes     Systolic Blood Pressure: 562 mmHg     Is BP treated: Yes     HDL Cholesterol: 56 mg/dL     Total Cholesterol: 133 mg/dL    A/P: Diabetes recently dx currently uncontrolled based on A1c. Patient is able to verbalize appropriate hypoglycemia management plan. Medication adherence appears suboptimal. Will change to XR metformin. I called her [facility] to ensure this is being filled for her. She plans to pick this up today. If tolerable, I recommend she increase her metformin dose. She can take two tablets ('1000mg'$  total) in the morning and two tablets ('1000mg'$  total) in the evening.  - Change  metformin to XR. Pt instructed to take two 500 mg XR tablets ('1000mg'$  total) BID.   -Patient educated on purpose, proper use, and potential adverse effects of metformin XR.  -Extensively discussed pathophysiology of diabetes, recommended lifestyle interventions, dietary effects on blood sugar control.  -Counseled on s/sx of and management of hypoglycemia.  -Next A1c anticipated 02/2022.   Written patient  instructions provided. Patient verbalized understanding of treatment plan. Total time in face to face counseling 20 minutes.    Follow up pharmacist clinic visit in 4 weeks.  Benard Halsted, PharmD, [place], [place] [phone]

## 2022-02-08 ENCOUNTER — Ambulatory Visit
Admission: RE | Admit: 2022-02-08 | Discharge: 2022-02-08 | Disposition: A | Discharge status: Home / Self Care | Payer: Medicare Other | Source: Ambulatory Visit | Attending: Physician Assistant | Admitting: Physician Assistant

## 2022-02-08 DIAGNOSIS — Z1231 Encounter for screening mammogram for malignant neoplasm of breast: Secondary | ICD-10-CM

## 2022-03-03 ENCOUNTER — Ambulatory Visit: Payer: Medicare Other | Attending: Internal Medicine | Admitting: Pharmacist

## 2022-03-03 ENCOUNTER — Encounter: Payer: Self-pay | Admitting: Pharmacist

## 2022-03-03 ENCOUNTER — Telehealth: Payer: Self-pay | Admitting: Nurse Practitioner

## 2022-03-03 DIAGNOSIS — E1165 Type 2 diabetes mellitus with hyperglycemia: Secondary | ICD-10-CM

## 2022-03-03 LAB — POCT GLYCOSYLATED HEMOGLOBIN (HGB A1C): HbA1c, POC (controlled diabetic range): 7.2 % — AB (ref 0.0–7.0)

## 2022-03-07 ENCOUNTER — Telehealth: Payer: Self-pay | Admitting: Nurse Practitioner

## 2022-03-07 NOTE — Telephone Encounter (Signed)
Can referral be sent to another eye doctor?

## 2022-03-09 NOTE — Telephone Encounter (Signed)
noted 

## 2022-03-15 ENCOUNTER — Ambulatory Visit: Payer: Medicare Other | Admitting: Nurse Practitioner

## 2022-04-06 ENCOUNTER — Ambulatory Visit (INDEPENDENT_AMBULATORY_CARE_PROVIDER_SITE_OTHER): Payer: Medicare Other

## 2022-04-06 ENCOUNTER — Ambulatory Visit (INDEPENDENT_AMBULATORY_CARE_PROVIDER_SITE_OTHER): Payer: Medicare Other | Admitting: Orthopaedic Surgery

## 2022-04-06 ENCOUNTER — Encounter: Payer: Self-pay | Admitting: Orthopaedic Surgery

## 2022-04-06 DIAGNOSIS — Z96652 Presence of left artificial knee joint: Secondary | ICD-10-CM

## 2022-04-06 DIAGNOSIS — M1612 Unilateral primary osteoarthritis, left hip: Secondary | ICD-10-CM

## 2022-04-06 MED ORDER — ACETAMINOPHEN-CODEINE 300-30 MG PO TABS: 1.0000 | ORAL_TABLET | Freq: Every day | ORAL | 0 refills | Status: AC | PRN

## 2022-04-06 NOTE — Progress Notes (Signed)
Office Visit Note   Patient: Judy Martinez           Date of Birth: Dec 31, 1950           MRN: [id number] Visit Date: 04/06/2022              Requested by: Gildardo Pounds, NP [place],  [place] [id number] PCP: Gildardo Pounds, NP   Assessment & Plan: Visit Diagnoses:  1. Hx of total knee replacement, left   2. Primary osteoarthritis of left hip     Plan: Based on findings her symptoms are actually coming from her hip joint and not her knee.  Her knee replacement looks good on x-ray and examination is unremarkable.  Findings are all consistent with degenerative joint disease of the left hip.  She had a cortisone injection 3 months ago which lasted about a month.  She continues to have severe pain interfering with ADLs and daily activities.  Based on her options she would like to move forward with a left total hip replacement.  Tylenol with codeine prescribed today.  Jackelyn Poling will need.  Questions encouraged and answered.  Risk benefits prognosis reviewed.  Follow-Up Instructions: No follow-ups on file.   Orders:  Orders Placed This Encounter  Procedures   XR Knee 1-2 Views Left   XR HIP UNILAT W OR W/O PELVIS 2-3 VIEWS LEFT   Meds ordered this encounter  Medications   acetaminophen-codeine (TYLENOL #3) 300-30 MG tablet    Sig: Take 1-2 tablets by mouth daily as needed for up to 7 days for moderate pain.    Dispense:  30 tablet    Refill:  0      Procedures: No procedures performed   Clinical Data: No additional findings.   Subjective: Chief Complaint  Patient presents with   Left Knee - Follow-up    History of Left TKA    HPI Judy Martinez returns today for follow-up of left knee replacement and left thigh and hip pain.  Underwent cortisone injection in her left hip in April which provided a month relief.  Walks with a cane.  Has constant severe left hip and groin and thigh pain.  This radiates down to the knee.  Review of Systems   Constitutional: Negative.   HENT: Negative.    Eyes: Negative.   Respiratory: Negative.    Cardiovascular: Negative.   Endocrine: Negative.   Musculoskeletal: Negative.   Neurological: Negative.   Hematological: Negative.   Psychiatric/Behavioral: Negative.    All other systems reviewed and are negative.    Objective: Vital Signs: LMP  (LMP Unknown)   Physical Exam Vitals and nursing note reviewed.  Constitutional:      Appearance: She is well-developed.  Pulmonary:     Effort: Pulmonary effort is normal.  Skin:    General: Skin is warm.     Capillary Refill: Capillary refill takes less than 2 seconds.  Neurological:     Mental Status: She is alert and oriented to person, place, and time.  Psychiatric:        Behavior: Behavior normal.        Thought Content: Thought content normal.        Judgment: Judgment normal.     Ortho Exam Examination left knee is unremarkable.  Fully healed surgical scar.  Good range of motion.  Stable to varus valgus stress.  Examination left hip shows weakness with hip flexion secondary to pain.  Pain with circumduction.  Pain with weightbearing.  No sciatic tension signs.  Lateral hip is nontender. Specialty Comments:  No specialty comments available.  Imaging: XR HIP UNILAT W OR W/O PELVIS 2-3 VIEWS LEFT  Result Date: 04/06/2022 Severe joint space narrowing of the left hip joint.  XR Knee 1-2 Views Left  Result Date: 04/06/2022 Stable total knee replacement in good alignment.     PMFS History: Patient Active Problem List   Diagnosis Date Noted   Status post total knee replacement, left 06/17/2019   Status post total left knee replacement 06/02/2019   Primary osteoarthritis of left knee    Elevated liver enzymes 09/25/2018   Cocaine use 09/25/2018   Smoker unmotivated to quit 09/25/2018   Chronic bilateral low back pain with sciatica 08/28/2018   Chronic pain of left knee 08/28/2018   Alcohol abuse    Chronic insomnia     Hyperlipidemia    Essential hypertension    History of CVA (cerebrovascular accident)    Past Medical History:  Diagnosis Date   Arthritis    Heart attack ([place]) 2004   Hypertension    Stroke ([place]) 2015    Family History  Problem Relation Age of Onset   Alcohol abuse Father    Esophageal cancer Neg Hx    Colon cancer Neg Hx    Colon polyps Neg Hx    Rectal cancer Neg Hx    Stomach cancer Neg Hx     Past Surgical History:  Procedure Laterality Date   ABDOMINAL HYSTERECTOMY     COLONOSCOPY  10/08/2019   previous in [place] Left 06/02/2019   Procedure: LEFT TOTAL KNEE ARTHROPLASTY;  Surgeon: Leandrew Koyanagi, MD;  Location: [facility];  Service: Orthopedics;  Laterality: Left;   TUBAL LIGATION     Social History   Occupational History   Not on file  Tobacco Use   Smoking status: Every Day    Packs/day: 0.50    Years: 15.00    Total pack years: 7.50    Types: Cigarettes   Smokeless tobacco: Never  Vaping Use   Vaping Use: Never used  Substance and Sexual Activity   Alcohol use: Yes    Comment: occ beer   Drug use: Yes    Frequency: 2.0 times per week    Types: Cocaine, Marijuana    Comment: last used cocaine few days ago, marijuana last used 2-3 weeks ago   Sexual activity: Yes    Birth control/protection: Surgical

## 2022-04-18 ENCOUNTER — Telehealth: Payer: Self-pay

## 2022-04-18 NOTE — Telephone Encounter (Signed)
Unable to leave a voicemail to return call.

## 2022-04-26 ENCOUNTER — Telehealth: Payer: Self-pay | Admitting: *Deleted

## 2022-06-21 ENCOUNTER — Ambulatory Visit: Payer: Medicare Other | Admitting: Nurse Practitioner

## 2022-08-09 ENCOUNTER — Ambulatory Visit: Payer: Medicare Other | Attending: Nurse Practitioner | Admitting: Family Medicine

## 2022-08-09 ENCOUNTER — Encounter: Payer: Self-pay | Admitting: Family Medicine

## 2022-08-09 VITALS — BP 131/80 | HR 100 | Temp 98.1°F | Ht 63.0 in | Wt 156.8 lb

## 2022-08-09 DIAGNOSIS — E1165 Type 2 diabetes mellitus with hyperglycemia: Secondary | ICD-10-CM | POA: Diagnosis not present

## 2022-08-09 DIAGNOSIS — Z23 Encounter for immunization: Secondary | ICD-10-CM | POA: Diagnosis not present

## 2022-08-09 DIAGNOSIS — F5104 Psychophysiologic insomnia: Secondary | ICD-10-CM | POA: Diagnosis not present

## 2022-08-09 DIAGNOSIS — I10 Essential (primary) hypertension: Secondary | ICD-10-CM

## 2022-08-09 LAB — POCT GLYCOSYLATED HEMOGLOBIN (HGB A1C): HbA1c, POC (controlled diabetic range): 7.4 % — AB (ref 0.0–7.0)

## 2022-08-09 MED ORDER — LISINOPRIL 10 MG PO TABS: ORAL_TABLET | ORAL | 1 refills | Status: DC

## 2022-08-09 MED ORDER — QUETIAPINE FUMARATE 50 MG PO TABS: 50.0000 mg | ORAL_TABLET | Freq: Every day | ORAL | 1 refills | Status: DC

## 2022-08-09 NOTE — Patient Instructions (Signed)
Pneumococcal Conjugate Vaccine Injection (Vaxneuvance) What is this medication? PNEUMOCOCCAL VACCINE (NEU mo KOK al vak SEEN) reduces the risk of pneumococcal disease, such as pneumonia. It does not treat pneumococcal disease. It is still possible to get pneumococcal disease after receiving this vaccine, but the symptoms may be less severe or not last as long. This medicine may be used for other purposes; ask your health care provider or pharmacist if you have questions. COMMON BRAND NAME(S): VAXNEUVANCE What should I tell my care team before I take this medication? They need to know if you have any of these conditions: Bleeding disorder Fever Immune system problems An unusual or allergic reaction to pneumococcal vaccine, diphtheria toxoid, other vaccines, other medications, foods, dyes, or preservatives Pregnant or trying to get pregnant Breast-feeding How should I use this medication? This vaccine is injected into a muscle. It is given by your care team. A copy of Vaccine Information Statements will be given before each vaccination. Be sure to read this information carefully each time. This sheet may change often. Talk to your care team about the use of this medication in children. While it may be given to children as young as 42 weeks of age for selected conditions, precautions do apply. Overdosage: If you think you have taken too much of this medicine contact a poison control center or emergency room at once. NOTE: This medicine is only for you. Do not share this medicine with others. What if I miss a dose? Keep appointments for follow-up doses. It is important not to miss your dose. Call your care team if you are unable to keep an appointment. What may interact with this medication? Medications for cancer chemotherapy Medications that suppress your immune function Steroid medications, such as prednisone or cortisone This list may not describe all possible interactions. Give your health  care provider a list of all the medicines, herbs, non-prescription drugs, or dietary supplements you use. Also tell them if you smoke, drink alcohol, or use illegal drugs. Some items may interact with your medicine. What should I watch for while using this medication? Visit your care team regularly. Report any side effects to your care team right away. This vaccine, like all vaccines, may not fully protect everyone. What side effects may I notice from receiving this medication? Side effects that you should report to your care team as soon as possible: Allergic reactions--skin rash, itching, hives, swelling of the face, lips, tongue, or throat Side effects that usually do not require medical attention (report these to your care team if they continue or are bothersome): Fatigue Fever Headache Joint pain Muscle pain Pain, redness, or irritation at injection site This list may not describe all possible side effects. Call your doctor for medical advice about side effects. You may report side effects to FDA at 1-800-FDA-1088. Where should I keep my medication? This vaccine is only given by your care team. It will not be stored at home. NOTE: This sheet is a summary. It may not cover all possible information. If you have questions about this medicine, talk to your doctor, pharmacist, or health care provider.  2023 Elsevier/Gold Standard (2022-03-23 00:00:00)

## 2022-08-09 NOTE — Progress Notes (Signed)
Subjective:  Patient ID: Judy Martinez, female    DOB: [birth year]  Age: 71 y.o. MRN: [id number]  CC: Diabetes   HPI Judy Martinez is a 71 y.o. year old female patient of [name] with a history of hypertension, type 2 diabetes mellitus (A1c 7.4), chronic insomnia, status post left TKA here for an office visit.  Interval History: She endorses adherence with metformin and has no hypoglycemic adverse effects.  She denies presence of visual concerns or neuropathy. Also endorses adherence with her statin and her antihypertensive and is tolerating her medications okay. She is requesting a refill of Seroquel which she takes for insomnia. Her major form of exercise by walking her dog.  Past Medical History:  Diagnosis Date   Arthritis    Heart attack ([place]) 2004   Hypertension    Stroke [facility]) 2015    Past Surgical History:  Procedure Laterality Date   ABDOMINAL HYSTERECTOMY     COLONOSCOPY  10/08/2019   previous in [place] Left 06/02/2019   Procedure: LEFT TOTAL KNEE ARTHROPLASTY;  Surgeon: Leandrew Koyanagi, MD;  Location: [facility];  Service: Orthopedics;  Laterality: Left;   TUBAL LIGATION      Family History  Problem Relation Age of Onset   Alcohol abuse Father    Esophageal cancer Neg Hx    Colon cancer Neg Hx    Colon polyps Neg Hx    Rectal cancer Neg Hx    Stomach cancer Neg Hx     Social History   Socioeconomic History   Marital status: Legally Separated    Spouse name: Not on file   Number of children: Not on file   Years of education: Not on file   Highest education level: Not on file  Occupational History   Not on file  Tobacco Use   Smoking status: Every Day    Packs/day: 0.50    Years: 15.00    Total pack years: 7.50    Types: Cigarettes   Smokeless tobacco: Never  Vaping Use   Vaping Use: Never used  Substance and Sexual Activity   Alcohol use: Yes    Comment: occ beer   Drug use: Yes    Frequency: 2.0 times per  week    Types: Cocaine, Marijuana    Comment: last used cocaine few days ago, marijuana last used 2-3 weeks ago   Sexual activity: Yes    Birth control/protection: Surgical  Other Topics Concern   Not on file  Social History Narrative   Not on file   Social Determinants of Health   Financial Resource Strain: Not on file  Food Insecurity: Not on file  Transportation Needs: Not on file  Physical Activity: Not on file  Stress: Not on file  Social Connections: Not on file    Allergies  Allergen Reactions   Penicillins Anaphylaxis and Hives    Did it involve swelling of the face/tongue/throat, SOB, or low BP? Yes Did it involve sudden or severe rash/hives, skin peeling, or any reaction on the inside of your mouth or nose? No Did you need to seek medical attention at a hospital or doctor's office? Yes When did it last happen?    Over 10 years ago   If all above answers are "NO", may proceed with cephalosporin use.     Outpatient Medications Prior to Visit  Medication Sig Dispense Refill   Accu-Chek Softclix Lancets lancets Use as instructed 100 each 12  albuterol (VENTOLIN HFA) 108 (90 Base) MCG/ACT inhaler INHALE 2 PUFFS INTO THE LUNGS EVERY 6 HOURS AS NEEDED FOR WHEEZING OR SHORTNESS OF BREATH 54 g 0   atorvastatin (LIPITOR) 40 MG tablet TAKE 1 TABLET BY MOUTH  DAILY 100 tablet 2   Blood Glucose Monitoring Suppl (ACCU-CHEK GUIDE ME) w/Device KIT 1 each by Does not apply route 2 (two) times daily. 1 kit 0   glucose blood (ACCU-CHEK GUIDE) test strip Use as instructed 100 each 12   metFORMIN (GLUCOPHAGE-XR) 500 MG 24 hr tablet Take 2 tablets (1,000 mg total) by mouth 2 (two) times daily. 360 tablet 1   traMADol (ULTRAM) 50 MG tablet Take 1 tablet (50 mg total) by mouth every 12 (twelve) hours as needed. 30 tablet 2   vitamin B-12 (CYANOCOBALAMIN) 1000 MCG tablet Take 1,000 mcg by mouth every 7 (seven) days.     Vitamin D, Ergocalciferol, (DRISDOL) 1.25 MG (50000 UNIT) CAPS capsule  TAKE 1 CAPSULE BY MOUTH 1 TIME A WEEK 12 capsule 0   lisinopril (ZESTRIL) 10 MG tablet TAKE 1 TABLET(10 MG) BY MOUTH DAILY. FOR BLOOD PRESSURE 90 tablet 0   QUEtiapine (SEROQUEL) 50 MG tablet Take 1 tablet (50 mg total) by mouth at bedtime. 90 tablet 1   No facility-administered medications prior to visit.     ROS Review of Systems  Constitutional:  Negative for activity change and appetite change.  HENT:  Negative for sinus pressure and sore throat.   Respiratory:  Negative for chest tightness, shortness of breath and wheezing.   Cardiovascular:  Negative for chest pain and palpitations.  Gastrointestinal:  Negative for abdominal distention, abdominal pain and constipation.  Genitourinary: Negative.   Musculoskeletal: Negative.   Psychiatric/Behavioral:  Negative for behavioral problems and dysphoric mood.     Objective:  BP 131/80   Pulse 100   Temp 98.1 F (36.7 C) (Oral)   Ht _0  (1.6 m)   Wt 156 lb 12.8 oz (71.1 kg)   LMP  (LMP Unknown)   SpO2 100%   BMI 27.78 kg/m      08/09/2022   10:46 AM 12/07/2021    8:43 AM 03/28/2021   10:13 AM  BP/Weight  Systolic BP 696 789 381  Diastolic BP 80 77 85  Wt. (Lbs) 156.8 155.2 156.4  BMI 27.78 kg/m2 27.49 kg/m2 27.71 kg/m2      Physical Exam Constitutional:      Appearance: She is well-developed.  Cardiovascular:     Rate and Rhythm: Normal rate.     Heart sounds: Normal heart sounds. No murmur heard. Pulmonary:     Effort: Pulmonary effort is normal.     Breath sounds: Normal breath sounds. No wheezing or rales.  Chest:     Chest wall: No tenderness.  Abdominal:     General: Bowel sounds are normal. There is no distension.     Palpations: Abdomen is soft. There is no mass.     Tenderness: There is no abdominal tenderness.  Musculoskeletal:     Right lower leg: No edema.     Left lower leg: No edema.     Comments: Slightly restricted range of motion in left knee  Neurological:     Mental Status: She is alert  and oriented to person, place, and time.  Psychiatric:        Mood and Affect: Mood normal.        Latest Ref Rng & Units 12/07/2021    9:29 AM 03/28/2021  10:34 AM 11/17/2020    3:51 PM  CMP  Glucose 70 - 99 mg/dL 267  149  110   BUN 8 - 27 mg/dL _0 Creatinine 0.57 - 1.00 mg/dL 1.00  0.91  0.96   Sodium 134 - 144 mmol/L 142  141  138   Potassium 3.5 - 5.2 mmol/L 3.8  4.0  3.8   Chloride 96 - 106 mmol/L 105  103  102   CO2 20 - 29 mmol/L _1 Calcium 8.7 - 10.3 mg/dL 10.0  9.7  9.8   Total Protein 6.0 - 8.5 g/dL 6.8  6.9  7.5   Total Bilirubin 0.0 - 1.2 mg/dL 0.2  <0.2  <0.2   Alkaline Phos 44 - 121 IU/L 97  94  82   AST 0 - 40 IU/L _2 ALT 0 - 32 IU/L _3 Lipid Panel     Component Value Date/Time   CHOL 133 12/07/2021 0929   TRIG 85 12/07/2021 0929   HDL 56 12/07/2021 0929   CHOLHDL 2.4 12/07/2021 0929   LDLCALC 61 12/07/2021 0929    CBC    Component Value Date/Time   WBC 5.3 12/07/2021 0929   WBC 8.9 06/03/2019 0444   RBC 4.10 12/07/2021 0929   RBC 3.04 (L) 06/03/2019 0444   HGB 12.2 12/07/2021 0929   HCT 36.8 12/07/2021 0929   PLT 326 12/07/2021 0929   MCV 90 12/07/2021 0929   MCH 29.8 12/07/2021 0929   MCH 31.6 06/03/2019 0444   MCHC 33.2 12/07/2021 0929   MCHC 31.6 06/03/2019 0444   RDW 12.7 12/07/2021 0929   LYMPHSABS 1.8 12/07/2021 0929   MONOABS 0.4 05/28/2019 1540   EOSABS 0.2 12/07/2021 0929   BASOSABS 0.0 12/07/2021 0929    Lab Results  Component Value Date   HGBA1C 7.4 (A) 08/09/2022    Assessment & Plan:  1. Type 2 diabetes mellitus with hyperglycemia, unspecified whether long term insulin use (HCC) Controlled with A1c of 7.4 Due to age it is reasonable to maintain her A1c at this level to prevent hypoglycemia Continue metformin Counseled on Diabetic diet, my plate method, 665 minutes of moderate intensity exercise/week Blood sugar logs with fasting goals of 80-120 mg/dl, random of less than 180 and  in the event of sugars less than 60 mg/dl or greater than 400 mg/dl encouraged to notify the clinic. Advised on the need for annual eye exams, annual foot exams, Pneumonia vaccine. - POCT glycosylated hemoglobin (Hb A1C) - Microalbumin/Creatinine Ratio, Urine  2. Essential hypertension Controlled Counseled on blood pressure goal of less than 130/80, low-sodium, DASH diet, medication compliance, 150 minutes of moderate intensity exercise per week. Discussed medication compliance, adverse effects. - Basic Metabolic Panel - lisinopril (ZESTRIL) 10 MG tablet; TAKE 1 TABLET(10 MG) BY MOUTH DAILY. FOR BLOOD PRESSURE  Dispense: 90 tablet; Refill: 1  3. Chronic insomnia Stable - QUEtiapine (SEROQUEL) 50 MG tablet; Take 1 tablet (50 mg total) by mouth at bedtime.  Dispense: 90 tablet; Refill: 1  4. Need for pneumococcal vaccine PCV 20 administered   Meds ordered this encounter  Medications   lisinopril (ZESTRIL) 10 MG tablet    Sig: TAKE 1 TABLET(10 MG) BY MOUTH DAILY. FOR BLOOD PRESSURE    Dispense:  90 tablet    Refill:  1   QUEtiapine (SEROQUEL) 50 MG tablet  Sig: Take 1 tablet (50 mg total) by mouth at bedtime.    Dispense:  90 tablet    Refill:  1    Follow-up: Return in about 6 months (around 02/07/2023) for Medical conditions with PCP.       Charlott Rakes, MD, FAAFP. [facility], [place]   08/09/2022, 11:29 AM

## 2022-08-10 LAB — BASIC METABOLIC PANEL
BUN/Creatinine Ratio: 14 (ref 12–28)
BUN: 15 mg/dL (ref 8–27)
CO2: 20 mmol/L (ref 20–29)
Calcium: 10.3 mg/dL (ref 8.7–10.3)
Chloride: 105 mmol/L (ref 96–106)
Creatinine, Ser: 1.09 mg/dL — ABNORMAL HIGH (ref 0.57–1.00)
Glucose: 111 mg/dL — ABNORMAL HIGH (ref 70–99)
Potassium: 4.4 mmol/L (ref 3.5–5.2)
Sodium: 141 mmol/L (ref 134–144)
eGFR: 55 mL/min/{1.73_m2} — ABNORMAL LOW (ref >59)

## 2022-11-08 ENCOUNTER — Ambulatory Visit: Payer: 59 | Attending: Nurse Practitioner

## 2022-11-08 VITALS — Ht 63.5 in | Wt 140.0 lb

## 2022-11-08 DIAGNOSIS — Z0000 Encounter for general adult medical examination without abnormal findings: Secondary | ICD-10-CM | POA: Diagnosis not present

## 2022-11-08 NOTE — Patient Instructions (Signed)
Ms. Judy Martinez , Thank you for taking time to come for your Medicare Wellness Visit. I appreciate your ongoing commitment to your health goals. Please review the following plan we discussed and let me know if I can assist you in the future.   These are the goals we discussed:  Goals      Patient Stated     11/08/2022, stay healthy        This is a list of the screening recommended for you and due dates:  Health Maintenance  Topic Date Due   COVID-19 Vaccine (1) Never done   Yearly kidney health urinalysis for diabetes  Never done   DTaP/Tdap/Td vaccine (1 - Tdap) Never done   Zoster (Shingles) Vaccine (1 of 2) Never done   Yearly kidney function blood test for diabetes  08/10/2023   Medicare Annual Wellness Visit  11/09/2023   Mammogram  02/09/2024   Colon Cancer Screening  10/07/2029   Pneumonia Vaccine  Completed   Flu Shot  Completed   DEXA scan (bone density measurement)  Completed   Hepatitis C Screening: USPSTF Recommendation to screen - Ages 44-79 yo.  Completed   HPV Vaccine  Aged Out    Advanced directives: Advance directive discussed with you today.   Conditions/risks identified: smoking  Next appointment: Follow up in one year for your annual wellness visit    Preventive Care 19 Years and Older, Female Preventive care refers to lifestyle choices and visits with your health care provider that can promote health and wellness. What does preventive care include? A yearly physical exam. This is also called an annual well check. Dental exams once or twice a year. Routine eye exams. Ask your health care provider how often you should have your eyes checked. Personal lifestyle choices, including: Daily care of your teeth and gums. Regular physical activity. Eating a healthy diet. Avoiding tobacco and drug use. Limiting alcohol use. Practicing safe sex. Taking low-dose aspirin every day. Taking vitamin and mineral supplements as recommended by your health care  provider. What happens during an annual well check? The services and screenings done by your health care provider during your annual well check will depend on your age, overall health, lifestyle risk factors, and family history of disease. Counseling  Your health care provider may ask you questions about your: Alcohol use. Tobacco use. Drug use. Emotional well-being. Home and relationship well-being. Sexual activity. Eating habits. History of falls. Memory and ability to understand (cognition). Work and work Statistician. Reproductive health. Screening  You may have the following tests or measurements: Height, weight, and BMI. Blood pressure. Lipid and cholesterol levels. These may be checked every 5 years, or more frequently if you are over 9 years old. Skin check. Lung cancer screening. You may have this screening every year starting at age 35 if you have a 30-pack-year history of smoking and currently smoke or have quit within the past 15 years. Fecal occult blood test (FOBT) of the stool. You may have this test every year starting at age 20. Flexible sigmoidoscopy or colonoscopy. You may have a sigmoidoscopy every 5 years or a colonoscopy every 10 years starting at age 54. Hepatitis C blood test. Hepatitis B blood test. Sexually transmitted disease (STD) testing. Diabetes screening. This is done by checking your blood sugar (glucose) after you have not eaten for a while (fasting). You may have this done every 1-3 years. Bone density scan. This is done to screen for osteoporosis. You may have this done starting  at age 3. Mammogram. This may be done every 1-2 years. Talk to your health care provider about how often you should have regular mammograms. Talk with your health care provider about your test results, treatment options, and if necessary, the need for more tests. Vaccines  Your health care provider may recommend certain vaccines, such as: Influenza vaccine. This is  recommended every year. Tetanus, diphtheria, and acellular pertussis (Tdap, Td) vaccine. You may need a Td booster every 10 years. Zoster vaccine. You may need this after age 66. Pneumococcal 13-valent conjugate (PCV13) vaccine. One dose is recommended after age 46. Pneumococcal polysaccharide (PPSV23) vaccine. One dose is recommended after age 81. Talk to your health care provider about which screenings and vaccines you need and how often you need them. This information is not intended to replace advice given to you by your health care provider. Make sure you discuss any questions you have with your health care provider. Document Released: 09/24/2015 Document Revised: 05/17/2016 Document Reviewed: 06/29/2015 Elsevier Interactive Patient Education  2017 [place] Prevention in the Home Falls can cause injuries. They can happen to people of all ages. There are many things you can do to make your home safe and to help prevent falls. What can I do on the outside of my home? Regularly fix the edges of walkways and driveways and fix any cracks. Remove anything that might make you trip as you walk through a door, such as a raised step or threshold. Trim any bushes or trees on the path to your home. Use bright outdoor lighting. Clear any walking paths of anything that might make someone trip, such as rocks or tools. Regularly check to see if handrails are loose or broken. Make sure that both sides of any steps have handrails. Any raised decks and porches should have guardrails on the edges. Have any leaves, snow, or ice cleared regularly. Use sand or salt on walking paths during winter. Clean up any spills in your garage right away. This includes oil or grease spills. What can I do in the bathroom? Use night lights. Install grab bars by the toilet and in the tub and shower. Do not use towel bars as grab bars. Use non-skid mats or decals in the tub or shower. If you need to sit down in  the shower, use a plastic, non-slip stool. Keep the floor dry. Clean up any water that spills on the floor as soon as it happens. Remove soap buildup in the tub or shower regularly. Attach bath mats securely with double-sided non-slip rug tape. Do not have throw rugs and other things on the floor that can make you trip. What can I do in the bedroom? Use night lights. Make sure that you have a light by your bed that is easy to reach. Do not use any sheets or blankets that are too big for your bed. They should not hang down onto the floor. Have a firm chair that has side arms. You can use this for support while you get dressed. Do not have throw rugs and other things on the floor that can make you trip. What can I do in the kitchen? Clean up any spills right away. Avoid walking on wet floors. Keep items that you use a lot in easy-to-reach places. If you need to reach something above you, use a strong step stool that has a grab bar. Keep electrical cords out of the way. Do not use floor polish or wax that makes  floors slippery. If you must use wax, use non-skid floor wax. Do not have throw rugs and other things on the floor that can make you trip. What can I do with my stairs? Do not leave any items on the stairs. Make sure that there are handrails on both sides of the stairs and use them. Fix handrails that are broken or loose. Make sure that handrails are as long as the stairways. Check any carpeting to make sure that it is firmly attached to the stairs. Fix any carpet that is loose or worn. Avoid having throw rugs at the top or bottom of the stairs. If you do have throw rugs, attach them to the floor with carpet tape. Make sure that you have a light switch at the top of the stairs and the bottom of the stairs. If you do not have them, ask someone to add them for you. What else can I do to help prevent falls? Wear shoes that: Do not have high heels. Have rubber bottoms. Are comfortable  and fit you well. Are closed at the toe. Do not wear sandals. If you use a stepladder: Make sure that it is fully opened. Do not climb a closed stepladder. Make sure that both sides of the stepladder are locked into place. Ask someone to hold it for you, if possible. Clearly mark and make sure that you can see: Any grab bars or handrails. First and last steps. Where the edge of each step is. Use tools that help you move around (mobility aids) if they are needed. These include: Canes. Walkers. Scooters. Crutches. Turn on the lights when you go into a dark area. Replace any light bulbs as soon as they burn out. Set up your furniture so you have a clear path. Avoid moving your furniture around. If any of your floors are uneven, fix them. If there are any pets around you, be aware of where they are. Review your medicines with your doctor. Some medicines can make you feel dizzy. This can increase your chance of falling. Ask your doctor what other things that you can do to help prevent falls. This information is not intended to replace advice given to you by your health care provider. Make sure you discuss any questions you have with your health care provider. Document Released: 06/24/2009 Document Revised: 02/03/2016 Document Reviewed: 10/02/2014 Elsevier Interactive Patient Education  2017 [employer].

## 2022-11-08 NOTE — Progress Notes (Signed)
I connected with  Judy Martinez on 11/08/22 by a audio enabled telemedicine application and verified that I am speaking with the correct person using two identifiers.  Patient Location: Home  Provider Location: Office/Clinic  I discussed the limitations of evaluation and management by telemedicine. The patient expressed understanding and agreed to proceed.  Subjective:   Judy Martinez is a 72 y.o. female who presents for Medicare Annual (Subsequent) preventive examination.  Review of Systems     Cardiac Risk Factors include: advanced age (>59mn, >>8women);dyslipidemia;hypertension;smoking/ tobacco exposure     Objective:    Today's Vitals   11/08/22 1226 11/08/22 1227  Weight: 140 lb (63.5 kg)   Height: 5' 3.5" (1.613 m)   PainSc:  6    Body mass index is 24.41 kg/m.     11/08/2022   12:31 PM 06/03/2019   10:09 AM 05/28/2019    3:12 PM 11/12/2018    8:36 AM 11/05/2018    8:41 AM  Advanced Directives  Does Patient Have a Medical Advance Directive? No No No  No  Would patient like information on creating a medical advance directive?  No - Patient declined No - Patient declined No - Patient declined No - Patient declined    Current Medications (verified) Outpatient Encounter Medications as of 11/08/2022  Medication Sig   Accu-Chek Softclix Lancets lancets Use as instructed   albuterol (VENTOLIN HFA) 108 (90 Base) MCG/ACT inhaler INHALE 2 PUFFS INTO THE LUNGS EVERY 6 HOURS AS NEEDED FOR WHEEZING OR SHORTNESS OF BREATH   atorvastatin (LIPITOR) 40 MG tablet TAKE 1 TABLET BY MOUTH  DAILY   Blood Glucose Monitoring Suppl (ACCU-CHEK GUIDE ME) w/Device KIT 1 each by Does not apply route 2 (two) times daily.   glucose blood (ACCU-CHEK GUIDE) test strip Use as instructed   lisinopril (ZESTRIL) 10 MG tablet TAKE 1 TABLET(10 MG) BY MOUTH DAILY. FOR BLOOD PRESSURE   metFORMIN (GLUCOPHAGE-XR) 500 MG 24 hr tablet Take 2 tablets (1,000 mg total) by mouth 2 (two) times daily.    QUEtiapine (SEROQUEL) 50 MG tablet Take 1 tablet (50 mg total) by mouth at bedtime.   vitamin B-12 (CYANOCOBALAMIN) 1000 MCG tablet Take 1,000 mcg by mouth every 7 (seven) days.   Vitamin D, Ergocalciferol, (DRISDOL) 1.25 MG (50000 UNIT) CAPS capsule TAKE 1 CAPSULE BY MOUTH 1 TIME A WEEK   traMADol (ULTRAM) 50 MG tablet Take 1 tablet (50 mg total) by mouth every 12 (twelve) hours as needed. (Patient not taking: Reported on 11/08/2022)   No facility-administered encounter medications on file as of 11/08/2022.    Allergies (verified) Penicillins   History: Past Medical History:  Diagnosis Date   Arthritis    Heart attack [facility] 2004   Hypertension    Stroke [facility] 2015   Past Surgical History:  Procedure Laterality Date   ABDOMINAL HYSTERECTOMY     COLONOSCOPY  10/08/2019   [place] [facility] [place]   Procedure: LEFT TOTAL KNEE ARTHROPLASTY;  Surgeon: XLeandrew Koyanagi MD;  [facility]  Service: Orthopedics;  Laterality: Left;   TUBAL LIGATION     Family History  Problem Relation Age of Onset   Alcohol abuse Father    Esophageal cancer Neg Hx    Colon cancer Neg Hx    Colon polyps Neg Hx    Rectal cancer Neg Hx    Stomach cancer Neg Hx    Social History   Socioeconomic History   Marital  status: Legally Separated    Spouse name: Not on file   Number of children: Not on file   Years of education: Not on file   Highest education level: Not on file  Occupational History   Not on file  Tobacco Use   Smoking status: Every Day    Packs/day: 0.50    Years: 15.00    Total pack years: 7.50    Types: Cigarettes   Smokeless tobacco: Never  Vaping Use   Vaping Use: Never used  Substance and Sexual Activity   Alcohol use: Yes    Comment: occ beer   Drug use: Not Currently    Frequency: 2.0 times per week    Types: Cocaine, Marijuana    Comment: last used cocaine few days ago, marijuana last used 2-3 weeks ago   Sexual activity: Yes     Birth control/protection: Surgical  Other Topics Concern   Not on file  Social History Narrative   Not on file   Social Determinants of Health   Financial Resource Strain: Low Risk  (11/08/2022)   Overall Financial Resource Strain (CARDIA)    Difficulty of Paying Living Expenses: Not hard at all  Food Insecurity: No Food Insecurity (11/08/2022)   Hunger Vital Sign    Worried About Running Out of Food in the Last Year: Never true    Ran Out of Food in the Last Year: Never true  Transportation Needs: No Transportation Needs (11/08/2022)   PRAPARE - Transportation    Lack of Transportation (Medical): No    Lack of Transportation (Non-Medical): No  Physical Activity: Inactive (11/08/2022)   Exercise Vital Sign    Days of Exercise per Week: 0 days    Minutes of Exercise per Session: 0 min  Stress: No Stress Concern Present (11/08/2022)   [place]    Feeling of Stress : Not at all  Social Connections: Not on file    Tobacco Counseling Ready to quit: Yes Counseling given: Not Answered   Clinical Intake:  Pre-visit preparation completed: Yes  Pain : 0-10 Pain Score: 6  Pain Type: Chronic pain Pain Location: Leg Pain Orientation: Left, Right Pain Descriptors / Indicators: Aching Pain Onset: More than a month ago Pain Frequency: Constant     Nutritional Status: BMI of 19-24  Normal Nutritional Risks: None Diabetes: No  How often do you need to have someone help you when you read instructions, pamphlets, or other written materials from your doctor or pharmacy?: 1 - Never  Diabetic? no  Interpreter Needed?: No  Information entered by :: NAllen LPN   Activities of Daily Living    11/08/2022   12:32 PM  In your present state of health, do you have any difficulty performing the following activities:  Hearing? 0  Vision? 0  Difficulty concentrating or making decisions? 0  Walking or climbing stairs? 0   Dressing or bathing? 0  Doing errands, shopping? 0  Preparing Food and eating ? N  Using the Toilet? N  In the past six months, have you accidently leaked urine? N  Do you have problems with loss of bowel control? N  Managing your Medications? N  Managing your Finances? N  Housekeeping or managing your Housekeeping? N    Patient Care Team: Gildardo Pounds, NP as PCP - General (Nurse Practitioner)  Indicate any recent Medical Services you may have received from other than [facility] providers in the past year (date may be  approximate).     Assessment:   This is a routine wellness examination for [place].  Hearing/Vision screen Vision Screening - Comments:: No regular eye exams,   Dietary issues and exercise activities discussed: Current Exercise Habits: The patient does not participate in regular exercise at present   Goals Addressed             This Visit's Progress    Patient Stated       11/08/2022, stay healthy       Depression Screen    11/08/2022   12:32 PM 08/09/2022   10:48 AM 03/28/2021   10:17 AM 11/17/2020    3:17 PM 04/28/2019    8:49 AM 01/14/2019   11:41 AM 10/15/2018    9:03 AM  PHQ 2/9 Scores  PHQ - 2 Score 0 0 0 1 0 0 2  PHQ- 9 Score  3   0 0 10    Fall Risk    11/08/2022   12:32 PM 08/09/2022   10:48 AM 12/07/2021    8:44 AM 03/28/2021   10:17 AM 11/17/2020    3:17 PM  Fall Risk   Falls in the past year? 0 0 1 0 0  Number falls in past yr: 0 0 0 0 0  Injury with Fall? 0 0 1 0 0  Risk for fall due to : Medication side effect   No Fall Risks   Follow up Falls prevention discussed;Education provided;Falls evaluation completed        FALL RISK PREVENTION PERTAINING TO THE HOME:  Any stairs in or around the home? Yes  If so, are there any without handrails? No  Home free of loose throw rugs in walkways, pet beds, electrical cords, etc? Yes  Adequate lighting in your home to reduce risk of falls? Yes   ASSISTIVE DEVICES UTILIZED TO PREVENT  FALLS:  Life alert? No  Use of a cane, walker or w/c? Yes  Grab bars in the bathroom? Yes  Shower chair or bench in shower? No  Elevated toilet seat or a handicapped toilet? No   TIMED UP AND GO:  Was the test performed? No .       Cognitive Function:        11/08/2022   12:33 PM  6CIT Screen  What Year? 0 points  What month? 0 points  What time? 0 points  Count back from 20 2 points  Months in reverse 4 points  Repeat phrase 0 points  Total Score 6 points    Immunizations Immunization History  Administered Date(s) Administered   Influenza Inj Mdck Quad Pf 08/09/2022   Influenza-Unspecified 05/17/2018   PNEUMOCOCCAL CONJUGATE-20 08/09/2022   Pneumococcal Polysaccharide-23 06/03/2019    TDAP status: Due, Education has been provided regarding the importance of this vaccine. Advised may receive this vaccine at local pharmacy or Health Dept. Aware to provide a copy of the vaccination record if obtained from local pharmacy or Health Dept. Verbalized acceptance and understanding.  Flu Vaccine status: Up to date  Pneumococcal vaccine status: Up to date  Covid-19 vaccine status: Completed vaccines  Qualifies for Shingles Vaccine? Yes   Zostavax completed No   Shingrix Completed?: No.    Education has been provided regarding the importance of this vaccine. Patient has been advised to call insurance company to determine out of pocket expense if they have not yet received this vaccine. Advised may also receive vaccine at local pharmacy or Health Dept. Verbalized acceptance and understanding.  Screening Tests  Health Maintenance  Topic Date Due   COVID-19 Vaccine (1) Never done   Diabetic kidney evaluation - Urine ACR  Never done   DTaP/Tdap/Td (1 - Tdap) Never done   Zoster Vaccines- Shingrix (1 of 2) Never done   Medicare Annual Wellness (AWV)  03/28/2022   Diabetic kidney evaluation - eGFR measurement  08/10/2023   MAMMOGRAM  02/09/2024   COLONOSCOPY (Pts 45-1yr  Insurance coverage will need to be confirmed)  10/07/2029   Pneumonia Vaccine 72 Years old  Completed   INFLUENZA VACCINE  Completed   DEXA SCAN  Completed   Hepatitis C Screening  Completed   HPV VACCINES  Aged Out    Health Maintenance  Health Maintenance Due  Topic Date Due   COVID-19 Vaccine (1) Never done   Diabetic kidney evaluation - Urine ACR  Never done   DTaP/Tdap/Td (1 - Tdap) Never done   Zoster Vaccines- Shingrix (1 of 2) Never done   Medicare Annual Wellness (AWV)  03/28/2022    Colorectal cancer screening: Type of screening: Colonoscopy. Completed 10/08/2019. Repeat every 10 years  Mammogram status: Completed 02/08/2022. Repeat every year  Bone Density status: Completed 07/24/2019.   Lung Cancer Screening: (Low Dose CT Chest recommended if Age 71-80years, 30 pack-year currently smoking OR have quit w/in 15years.) does not qualify.   Lung Cancer Screening Referral: no  Additional Screening:  Hepatitis C Screening: does qualify; Completed 09/08/2019 Vision Screening: Recommended annual ophthalmology exams for early detection of glaucoma and other disorders of the eye. Is the patient up to date with their annual eye exam?  Yes  Who is the provider or what is the name of the office in which the patient attends annual eye exams? Can't remember name If pt is not established with a provider, would they like to be referred to a provider to establish care? No .   Dental Screening: Recommended annual dental exams for proper oral hygiene  Community Resource Referral / Chronic Care Management: CRR required this visit?  No   CCM required this visit?  No      Plan:     I have personally reviewed and noted the following in the patient's chart:   Medical and social history Use of alcohol, tobacco or illicit drugs  Current medications and supplements including opioid prescriptions. Patient is not currently taking opioid prescriptions. Functional ability and  status Nutritional status Physical activity Advanced directives List of other physicians Hospitalizations, surgeries, and ER visits in previous 12 months Vitals Screenings to include cognitive, depression, and falls Referrals and appointments  In addition, I have reviewed and discussed with patient certain preventive protocols, quality metrics, and best practice recommendations. A written personalized care plan for preventive services as well as general preventive health recommendations were provided to patient.     NKellie Simmering LPN   [id number]  Nurse Notes: none  Due to this being a virtual visit, the after visit summary with patients personalized plan was offered to patient via mail or my-chart.  to pick up at office at next visit

## 2022-11-27 ENCOUNTER — Encounter: Payer: Self-pay | Admitting: *Deleted

## 2022-11-28 ENCOUNTER — Other Ambulatory Visit: Payer: Self-pay | Admitting: *Deleted

## 2022-11-28 DIAGNOSIS — E782 Mixed hyperlipidemia: Secondary | ICD-10-CM

## 2022-11-28 MED ORDER — ATORVASTATIN CALCIUM 40 MG PO TABS: 40.0000 mg | ORAL_TABLET | Freq: Every day | ORAL | 0 refills | Status: DC

## 2022-12-13 ENCOUNTER — Telehealth: Payer: Self-pay | Admitting: *Deleted

## 2022-12-13 NOTE — Telephone Encounter (Signed)
Called patient to follow up on her referral to Dr. Katy Fitch for an eye exam. She was given the phone number to their office.  [phone]

## 2022-12-27 ENCOUNTER — Ambulatory Visit: Payer: 59 | Attending: Nurse Practitioner | Admitting: Pharmacist

## 2022-12-27 DIAGNOSIS — Z79899 Other long term (current) drug therapy: Secondary | ICD-10-CM

## 2022-12-27 DIAGNOSIS — E1165 Type 2 diabetes mellitus with hyperglycemia: Secondary | ICD-10-CM

## 2022-12-27 DIAGNOSIS — I10 Essential (primary) hypertension: Secondary | ICD-10-CM

## 2022-12-27 DIAGNOSIS — E782 Mixed hyperlipidemia: Secondary | ICD-10-CM

## 2022-12-27 MED ORDER — ATORVASTATIN CALCIUM 40 MG PO TABS: 40.0000 mg | ORAL_TABLET | Freq: Every day | ORAL | 2 refills | Status: DC

## 2022-12-27 MED ORDER — LISINOPRIL 10 MG PO TABS: ORAL_TABLET | ORAL | 2 refills | Status: DC

## 2022-12-27 MED ORDER — METFORMIN HCL ER 500 MG PO TB24: 1000.0000 mg | ORAL_TABLET | Freq: Two times a day (BID) | ORAL | 2 refills | Status: DC

## 2022-12-27 NOTE — Progress Notes (Signed)
12/27/2022 Name: Judy Martinez MRN: [id number] DOB: Apr 24, 1951  No chief complaint on file.   Judy Martinez is a 72 y.o. year old female who presented for a telephone visit.   They were referred to the pharmacist by a quality report for assistance in managing medication access.   Patient is participating in a Managed Medicaid Plan:  No  Subjective:  Care Team: Primary Care Provider: Claiborne Rigg, NP ; Next Scheduled Visit: 02/07/2023  Medication Access/Adherence  Current Pharmacy:  [facility] - [place], [place] - [address] [place] [address] Phone: [phone] Fax: [phone]  [facility] [phone] - [place], [place] - [address] AT [facility] [address] [place] [address] Phone: [phone] Fax: [phone]   Patient reports affordability concerns with their medications: No  Patient reports access/transportation concerns to their pharmacy: No  Patient reports adherence concerns with their medications:  No     Medication Management:  Current adherence strategy: uses 90 day supplies via [facility]  Patient reports Good adherence to medications  Patient reports the following barriers to adherence: none  Recent fill dates: up to date    Objective:  Lab Results  Component Value Date   HGBA1C 7.4 (A) 08/09/2022    Lab Results  Component Value Date   CREATININE 1.09 (H) 08/09/2022   BUN 15 08/09/2022   NA 141 08/09/2022   K 4.4 08/09/2022   CL 105 08/09/2022   CO2 20 08/09/2022    Lab Results  Component Value Date   CHOL 133 12/07/2021   HDL 56 12/07/2021   LDLCALC 61 12/07/2021   TRIG 85 12/07/2021   CHOLHDL 2.4 12/07/2021    Medications Reviewed Today     Reviewed by Barb Merino, LPN (Licensed Practical Nurse) on 11/08/22 at 1230  Med List Status: <None>   Medication Order Taking? Sig Documenting  Provider Last Dose Status Informant  Accu-Chek Softclix Lancets lancets [id number] Yes Use as instructed Anders Simmonds, PA-C Taking Active   albuterol (VENTOLIN HFA) 108 (90 Base) MCG/ACT inhaler [id number] Yes INHALE 2 PUFFS INTO THE LUNGS EVERY 6 HOURS AS NEEDED FOR WHEEZING OR SHORTNESS OF Tonette Bihari, NP Taking Active   atorvastatin (LIPITOR) 40 MG tablet [id number] Yes TAKE 1 TABLET BY MOUTH  DAILY Claiborne Rigg, NP Taking Active   Blood Glucose Monitoring Suppl (ACCU-CHEK GUIDE ME) w/Device KIT [id number] Yes 1 each by Does not apply route 2 (two) times daily. Georgian Co M, [place] Taking Active   glucose blood (ACCU-CHEK GUIDE) test strip [id number] Yes Use as instructed Anders Simmonds, PA-C Taking Active   lisinopril (ZESTRIL) 10 MG tablet [id number] Yes TAKE 1 TABLET(10 MG) BY MOUTH DAILY. FOR BLOOD PRESSURE Hoy Register, MD Taking Active   metFORMIN (GLUCOPHAGE-XR) 500 MG 24 hr tablet [id number] Yes Take 2 tablets (1,000 mg total) by mouth 2 (two) times daily. Hoy Register, MD Taking Active   QUEtiapine (SEROQUEL) 50 MG tablet [id number] Yes Take 1 tablet (50 mg total) by mouth at bedtime. Hoy Register, MD Taking Active   traMADol (ULTRAM) 50 MG tablet [id number] No Take 1 tablet (50 mg total) by mouth every 12 (twelve) hours as needed.  Patient not taking: Reported on 11/08/2022   Cristie Hem, PA-C Not Taking Active   vitamin B-12 (CYANOCOBALAMIN) 1000 MCG tablet [id number] Yes Take 1,000 mcg by mouth  every 7 (seven) days. [provider] Taking Active Self  Vitamin D, Ergocalciferol, (DRISDOL) 1.25 MG (50000 UNIT) CAPS capsule [id number] Yes TAKE 1 CAPSULE BY MOUTH 1 TIME A WEEK McClung, Angela M, PA-C Taking Active               Assessment/Plan:   Medication Management: - Currently strategy sufficient to maintain appropriate adherence to prescribed medication regimen - Suggested use of weekly pill box to organize medications - Created list  of medication, indication, and administration time. Provided to patient  Follow Up Plan: with PCP 02/07/2023.  Butch Penny, PharmD, Patsy Baltimore, CPP Clinical Pharmacist [facility] [phone]

## 2023-02-07 ENCOUNTER — Ambulatory Visit: Payer: 59 | Admitting: Nurse Practitioner

## 2023-02-19 NOTE — Telephone Encounter (Signed)
complete

## 2023-04-02 ENCOUNTER — Other Ambulatory Visit: Payer: Self-pay | Admitting: Family Medicine

## 2023-04-02 DIAGNOSIS — F5104 Psychophysiologic insomnia: Secondary | ICD-10-CM

## 2023-04-03 NOTE — Telephone Encounter (Signed)
Requested medications are due for refill today.  yes  Requested medications are on the active medications list.  yes  Last refill. 08/09/2022 #90 1 rf  Future visit scheduled.   yes  Notes to clinic.  Labs are expired. Refill not delegated.    Requested Prescriptions  Pending Prescriptions Disp Refills   QUEtiapine (SEROQUEL) 50 MG tablet [Pharmacy Med Name: QUETIAPINE 50MG   TABLETS] 90 tablet 1    Sig: TAKE 1 TABLET(50 MG) BY MOUTH AT BEDTIME     Not Delegated - Psychiatry:  Antipsychotics - Second Generation (Atypical) - quetiapine Failed - 04/02/2023 10:19 AM      Failed - This refill cannot be delegated      Failed - TSH in normal range and within 360 days    No results found for: "TSH", "POCTSH", "TSHREFLEX"       Failed - Lipid Panel in normal range within the last 12 months    Cholesterol, Total  Date Value Ref Range Status  12/07/2021 133 100 - 199 mg/dL Final   LDL Chol Calc (NIH)  Date Value Ref Range Status  12/07/2021 61 0 - 99 mg/dL Final   HDL  Date Value Ref Range Status  12/07/2021 56 >39 mg/dL Final   Triglycerides  Date Value Ref Range Status  12/07/2021 85 0 - 149 mg/dL Final         Failed - CBC within normal limits and completed in the last 12 months    WBC  Date Value Ref Range Status  12/07/2021 5.3 3.4 - 10.8 x10E3/uL Final  06/03/2019 8.9 4.0 - 10.5 K/uL Final   RBC  Date Value Ref Range Status  12/07/2021 4.10 3.77 - 5.28 x10E6/uL Final  06/03/2019 3.04 (L) 3.87 - 5.11 MIL/uL Final   Hemoglobin  Date Value Ref Range Status  12/07/2021 12.2 11.1 - 15.9 g/dL Final   Hematocrit  Date Value Ref Range Status  12/07/2021 36.8 34.0 - 46.6 % Final   MCHC  Date Value Ref Range Status  12/07/2021 33.2 31.5 - 35.7 g/dL Final  16/06/9603 54.0 30.0 - 36.0 g/dL Final   [facility]  Date Value Ref Range Status  12/07/2021 29.8 26.6 - 33.0 pg Final  06/03/2019 31.6 26.0 - 34.0 pg Final   MCV  Date Value Ref Range Status  12/07/2021 90 79 - 97  fL Final   No results found for: "PLTCOUNTKUC", "LABPLAT", "POCPLA" RDW  Date Value Ref Range Status  12/07/2021 12.7 11.7 - 15.4 % Final         Failed - CMP within normal limits and completed in the last 12 months    Albumin  Date Value Ref Range Status  12/07/2021 4.2 3.8 - 4.8 g/dL Final   Alkaline Phosphatase  Date Value Ref Range Status  12/07/2021 97 44 - 121 IU/L Final   ALT  Date Value Ref Range Status  12/07/2021 17 0 - 32 IU/L Final   AST  Date Value Ref Range Status  12/07/2021 18 0 - 40 IU/L Final   BUN  Date Value Ref Range Status  08/09/2022 15 8 - 27 mg/dL Final   Calcium  Date Value Ref Range Status  08/09/2022 10.3 8.7 - 10.3 mg/dL Final   CO2  Date Value Ref Range Status  08/09/2022 20 20 - 29 mmol/L Final   Creatinine, Ser  Date Value Ref Range Status  08/09/2022 1.09 (H) 0.57 - 1.00 mg/dL Final   Glucose  Date Value Ref Range Status  08/09/2022 111 (H) 70 - 99 mg/dL Final   Glucose, Bld  Date Value Ref Range Status  06/03/2019 171 (H) 70 - 99 mg/dL Final   POC Glucose  Date Value Ref Range Status  12/07/2021 248 (A) 70 - 99 mg/dl Final   Potassium  Date Value Ref Range Status  08/09/2022 4.4 3.5 - 5.2 mmol/L Final   Sodium  Date Value Ref Range Status  08/09/2022 141 134 - 144 mmol/L Final   Bilirubin Total  Date Value Ref Range Status  12/07/2021 0.2 0.0 - 1.2 mg/dL Final   Bilirubin, Direct  Date Value Ref Range Status  09/12/2018 0.05 0.00 - 0.40 mg/dL Final   Total Protein  Date Value Ref Range Status  12/07/2021 6.8 6.0 - 8.5 g/dL Final   GFR calc Af Amer  Date Value Ref Range Status  09/08/2019 62 >59 mL/min/1.73 Final   eGFR  Date Value Ref Range Status  08/09/2022 55 (L) >59 mL/min/1.73 Final   GFR calc non Af Amer  Date Value Ref Range Status  09/08/2019 54 (L) >59 mL/min/1.73 Final         Passed - Last BP in normal range    BP Readings from Last 1 Encounters:  08/09/22 131/80         Passed -  Last Heart Rate in normal range    Pulse Readings from Last 1 Encounters:  08/09/22 100         Passed - Valid encounter within last 6 months    Recent Outpatient Visits           3 months ago Medication management   [facility] [place], Carlisle L, RPH-CPP   7 months ago Type 2 diabetes mellitus with hyperglycemia, unspecified whether long term insulin use (HCC)   [facility] [place], Odette Horns, MD   1 year ago Type 2 diabetes mellitus with hyperglycemia, unspecified whether long term insulin use [facility])   [facility] [place], Jeannett Senior L, RPH-CPP   1 year ago Type 2 diabetes mellitus with hyperglycemia, unspecified whether long term insulin use [facility])   [place] [facility] [place], Cornelius Moras, RPH-CPP   1 year ago Type 2 diabetes mellitus with hyperglycemia, unspecified whether long term insulin use [facility])    [facility] [place], Cornelius Moras, RPH-CPP       Future Appointments             In 2 weeks Claiborne Rigg, NP [facility]

## 2023-04-23 ENCOUNTER — Encounter: Payer: Self-pay | Admitting: Nurse Practitioner

## 2023-04-23 ENCOUNTER — Ambulatory Visit: Payer: 59 | Attending: Nurse Practitioner | Admitting: Nurse Practitioner

## 2023-04-23 VITALS — BP 136/78 | HR 87 | Ht 63.5 in | Wt 145.0 lb

## 2023-04-23 DIAGNOSIS — E7800 Pure hypercholesterolemia, unspecified: Secondary | ICD-10-CM

## 2023-04-23 DIAGNOSIS — I10 Essential (primary) hypertension: Secondary | ICD-10-CM

## 2023-04-23 DIAGNOSIS — F17200 Nicotine dependence, unspecified, uncomplicated: Secondary | ICD-10-CM | POA: Diagnosis not present

## 2023-04-23 DIAGNOSIS — E1165 Type 2 diabetes mellitus with hyperglycemia: Secondary | ICD-10-CM | POA: Diagnosis not present

## 2023-04-23 DIAGNOSIS — Z7984 Long term (current) use of oral hypoglycemic drugs: Secondary | ICD-10-CM

## 2023-04-23 DIAGNOSIS — D649 Anemia, unspecified: Secondary | ICD-10-CM

## 2023-04-23 DIAGNOSIS — E559 Vitamin D deficiency, unspecified: Secondary | ICD-10-CM

## 2023-04-23 DIAGNOSIS — M1712 Unilateral primary osteoarthritis, left knee: Secondary | ICD-10-CM

## 2023-04-23 LAB — POCT GLYCOSYLATED HEMOGLOBIN (HGB A1C): HbA1c, POC (controlled diabetic range): 6.5 % (ref 0.0–7.0)

## 2023-04-23 MED ORDER — LISINOPRIL 10 MG PO TABS
ORAL_TABLET | ORAL | 2 refills | Status: DC
Start: 2023-04-23 — End: 2023-05-02

## 2023-04-23 MED ORDER — ACCU-CHEK GUIDE VI STRP: ORAL_STRIP | 12 refills | Status: AC

## 2023-04-23 MED ORDER — GABAPENTIN 100 MG PO CAPS
100.0000 mg | ORAL_CAPSULE | Freq: Three times a day (TID) | ORAL | 3 refills | Status: DC
Start: 2023-04-23 — End: 2023-04-23

## 2023-04-23 MED ORDER — ALBUTEROL SULFATE HFA 108 (90 BASE) MCG/ACT IN AERS
2.0000 | INHALATION_SPRAY | Freq: Four times a day (QID) | RESPIRATORY_TRACT | 0 refills | Status: AC | PRN
Start: 2023-04-23 — End: ?

## 2023-04-23 MED ORDER — GABAPENTIN 100 MG PO CAPS
100.0000 mg | ORAL_CAPSULE | Freq: Three times a day (TID) | ORAL | 3 refills | Status: DC
Start: 2023-04-23 — End: 2023-05-04

## 2023-04-23 MED ORDER — ACCU-CHEK SOFTCLIX LANCETS MISC
12 refills | Status: AC
Start: 2023-04-23 — End: ?

## 2023-04-23 MED ORDER — METFORMIN HCL ER 500 MG PO TB24
1000.0000 mg | ORAL_TABLET | Freq: Two times a day (BID) | ORAL | 2 refills | Status: DC
Start: 2023-04-23 — End: 2023-05-02

## 2023-04-23 MED ORDER — ATORVASTATIN CALCIUM 40 MG PO TABS
40.0000 mg | ORAL_TABLET | Freq: Every day | ORAL | 2 refills | Status: DC
Start: 2023-04-23 — End: 2023-05-04

## 2023-04-23 MED ORDER — ACCU-CHEK GUIDE ME W/DEVICE KIT
1.0000 | PACK | Freq: Two times a day (BID) | 0 refills | Status: AC
Start: 2023-04-23 — End: ?

## 2023-04-23 MED ORDER — VITAMIN D (ERGOCALCIFEROL) 1.25 MG (50000 UNIT) PO CAPS
ORAL_CAPSULE | ORAL | 0 refills | Status: DC
Start: 2023-04-23 — End: 2024-05-14

## 2023-04-23 NOTE — Progress Notes (Signed)
Assessment & Plan:  Judy Martinez was seen today for medical management of chronic issues.  Diagnoses and all orders for this visit:  Type 2 diabetes mellitus with hyperglycemia, unspecified whether long term insulin use (HCC) Well controlled -     CMP14+EGFR -     POCT glycosylated hemoglobin (Hb A1C) -     metFORMIN (GLUCOPHAGE-XR) 500 MG 24 hr tablet; Take 2 tablets (1,000 mg total) by mouth 2 (two) times daily. FOR DIABETES -     Accu-Chek Softclix Lancets lancets; Use as instructed. Check blood glucose level by fingerstick once per day. E11.65 -     glucose blood (ACCU-CHEK GUIDE) test strip; Use as instructed. Check blood glucose level by fingerstick once per day. E11.65 -     Blood Glucose Monitoring Suppl (ACCU-CHEK GUIDE ME) w/Device KIT; 1 each by Does not apply route 2 (two) times daily.  Primary hypertension -     CMP14+EGFR -     lisinopril (ZESTRIL) 10 MG tablet; TAKE 1 TABLET(10 MG) BY MOUTH DAILY. FOR BLOOD PRESSURE Continue all antihypertensives as prescribed.  Reminded to bring in blood pressure log for follow  up appointment.  RECOMMENDATIONS: DASH/Mediterranean Diets are healthier choices for HTN.    Hypercholesterolemia -     Lipid panel -     atorvastatin (LIPITOR) 40 MG tablet; Take 1 tablet (40 mg total) by mouth daily. FOR CHOLESTEROL INSTRUCTIONS: Work on a low fat, heart healthy diet and participate in regular aerobic exercise program by working out at least 150 minutes per week; 5 days a week-30 minutes per day. Avoid red meat/beef/steak,  fried foods. junk foods, sodas, sugary drinks, unhealthy snacking, alcohol and smoking.  Drink at least 80 oz of water per day and monitor your carbohydrate intake daily.    Primary osteoarthritis of left knee -     Ambulatory referral to Orthopedics -     Discontinue: gabapentin (NEURONTIN) 100 MG capsule; Take 1 capsule (100 mg total) by mouth 3 (three) times daily. -     gabapentin (NEURONTIN) 100 MG capsule; Take 1 capsule  (100 mg total) by mouth 3 (three) times daily. For knee pain  Vitamin D deficiency disease -     VITAMIN D 25 Hydroxy (Vit-D Deficiency, Fractures) -     Vitamin D, Ergocalciferol, (DRISDOL) 1.25 MG (50000 UNIT) CAPS capsule; TAKE 1 CAPSULE BY MOUTH 1 TIME A WEEK  Anemia, unspecified type -     CBC with Differential/Platelet  Tobacco dependence -     albuterol (VENTOLIN HFA) 108 (90 Base) MCG/ACT inhaler; Inhale 2 puffs into the lungs every 6 (six) hours as needed for wheezing or shortness of breath. -     CT CHEST LUNG CA SCREEN LOW DOSE W/O CM; Future    Patient has been counseled on age-appropriate routine health concerns for screening and prevention. These are reviewed and up-to-date. Referrals have been placed accordingly. Immunizations are up-to-date or declined.    Subjective:   Chief Complaint  Patient presents with   Medical Management of Chronic Issues   HPI Judy Martinez 72 y.o. female presents to office today for follow up to DM and HTN. She also has complaints of chronic left knee and left hip pain today. She was seeing Dr Judy Martinez for this last year and hip replacement was discussed. I will refer back to ortho today.   I have not seen her personally in this office in over 2 years.   PMH significant for  Arthritis, MI (2004),  Hypertension, DM 2, HPL, L knee OA with TKR 05-2020 and Stroke (2015).   HTN Blood pressure is slightly above goal of <130/80. She did smoke a cigarette prior to her appointment this morning and there is also a strong smell of alcohol in the exam room.  BP Readings from Last 3 Encounters:  04/23/23 136/78  08/09/22 131/80  12/07/21 113/77     DM2 Diabetes is well controlled. Down from 7.4 to 6.5 today. Lab Results  Component Value Date   HGBA1C 6.5 04/23/2023    Lab Results  Component Value Date   HGBA1C 7.4 (A) 08/09/2022    Joint Pain She has a history of L TKR and primary OA of left hip. Last visit with ortho 04-06-2022: Plan:  Based on findings her symptoms are actually coming from her hip joint and not her knee.  Her knee replacement looks good on x-ray and examination is unremarkable.  Findings are all consistent with degenerative joint disease of the left hip.  She had a cortisone injection 3 months ago which lasted about a month.  She continues to have severe pain interfering with ADLs and daily activities.  Based on her options she would like to move forward with a left total hip replacement.  Tylenol with codeine prescribed today.  Judy Martinez will need.  Questions encouraged and answered.  Risk benefits prognosis reviewed.    Today she continues to endorse mostly pain in the left hip that radiates down into the left knee. Aggravating factors: any weight bearing, standing or sitting for prolonged periods of time. Requesting pain medication today.    ROS  Past Medical History:  Diagnosis Date   Arthritis    Heart attack (HCC) 2004   Hypertension    Stroke [facility]) 2015    Past Surgical History:  Procedure Laterality Date   ABDOMINAL HYSTERECTOMY     COLONOSCOPY  10/08/2019   previous in [place]   TOTAL KNEE ARTHROPLASTY Left 06/02/2019   Procedure: LEFT TOTAL KNEE ARTHROPLASTY;  Surgeon: Judy Kos, MD;  Location: [facility] OR;  Service: Orthopedics;  Laterality: Left;   TUBAL LIGATION      Family History  Problem Relation Age of Onset   Alcohol abuse Father    Esophageal cancer Neg Hx    Colon cancer Neg Hx    Colon polyps Neg Hx    Rectal cancer Neg Hx    Stomach cancer Neg Hx     Social History Reviewed with no changes to be made today.   Outpatient Medications Prior to Visit  Medication Sig Dispense Refill   QUEtiapine (SEROQUEL) 50 MG tablet TAKE 1 TABLET(50 MG) BY MOUTH AT BEDTIME 30 tablet 0   vitamin B-12 (CYANOCOBALAMIN) 1000 MCG tablet Take 1,000 mcg by mouth every 7 (seven) days.     atorvastatin (LIPITOR) 40 MG tablet Take 1 tablet (40 mg total) by mouth daily. 90 tablet 2   lisinopril (ZESTRIL)  10 MG tablet TAKE 1 TABLET(10 MG) BY MOUTH DAILY. FOR BLOOD PRESSURE 90 tablet 2   metFORMIN (GLUCOPHAGE-XR) 500 MG 24 hr tablet Take 2 tablets (1,000 mg total) by mouth 2 (two) times daily. 360 tablet 2   Vitamin D, Ergocalciferol, (DRISDOL) 1.25 MG (50000 UNIT) CAPS capsule TAKE 1 CAPSULE BY MOUTH 1 TIME A WEEK 12 capsule 0   traMADol (ULTRAM) 50 MG tablet Take 1 tablet (50 mg total) by mouth every 12 (twelve) hours as needed. (Patient not taking: Reported on 11/08/2022) 30 tablet 2   Accu-Chek  Softclix Lancets lancets Use as instructed (Patient not taking: Reported on 04/23/2023) 100 each 12   albuterol (VENTOLIN HFA) 108 (90 Base) MCG/ACT inhaler INHALE 2 PUFFS INTO THE LUNGS EVERY 6 HOURS AS NEEDED FOR WHEEZING OR SHORTNESS OF BREATH (Patient not taking: Reported on 04/23/2023) 54 g 0   Blood Glucose Monitoring Suppl (ACCU-CHEK GUIDE ME) w/Device KIT 1 each by Does not apply route 2 (two) times daily. (Patient not taking: Reported on 04/23/2023) 1 kit 0   glucose blood (ACCU-CHEK GUIDE) test strip Use as instructed (Patient not taking: Reported on 04/23/2023) 100 each 12   No facility-administered medications prior to visit.    Allergies  Allergen Reactions   Penicillins Anaphylaxis and Hives    Did it involve swelling of the face/tongue/throat, SOB, or low BP? Yes Did it involve sudden or severe rash/hives, skin peeling, or any reaction on the inside of your mouth or nose? No Did you need to seek medical attention at a hospital or doctor's office? Yes When did it last happen?    Over 10 years ago   If all above answers are "NO", may proceed with cephalosporin use.        Objective:    BP 136/78   Pulse 87   Ht 5' 3.5" (1.613 m)   Wt 145 lb (65.8 kg)   LMP  (LMP Unknown)   SpO2 99%   BMI 25.28 kg/m  Wt Readings from Last 3 Encounters:  04/23/23 145 lb (65.8 kg)  11/08/22 140 lb (63.5 kg)  08/09/22 156 lb 12.8 oz (71.1 kg)    Physical Exam       Patient has been counseled  extensively about nutrition and exercise as well as the importance of adherence with medications and regular follow-up. The patient was given clear instructions to go to ER or return to medical center if symptoms don't improve, worsen or new problems develop. The patient verbalized understanding.   Follow-up: No follow-ups on file.   Claiborne Rigg, FNP-BC [facility] [place], [place] [phone]   04/23/2023, 1:24 PM

## 2023-04-24 NOTE — Progress Notes (Signed)
Scheduled

## 2023-05-02 ENCOUNTER — Encounter: Payer: Self-pay | Admitting: Orthopaedic Surgery

## 2023-05-02 ENCOUNTER — Other Ambulatory Visit (INDEPENDENT_AMBULATORY_CARE_PROVIDER_SITE_OTHER): Payer: 59

## 2023-05-02 ENCOUNTER — Other Ambulatory Visit: Payer: Self-pay | Admitting: Nurse Practitioner

## 2023-05-02 ENCOUNTER — Ambulatory Visit (INDEPENDENT_AMBULATORY_CARE_PROVIDER_SITE_OTHER): Payer: 59 | Admitting: Orthopaedic Surgery

## 2023-05-02 DIAGNOSIS — F5104 Psychophysiologic insomnia: Secondary | ICD-10-CM

## 2023-05-02 DIAGNOSIS — M25562 Pain in left knee: Secondary | ICD-10-CM

## 2023-05-02 DIAGNOSIS — I10 Essential (primary) hypertension: Secondary | ICD-10-CM

## 2023-05-02 DIAGNOSIS — G8929 Other chronic pain: Secondary | ICD-10-CM

## 2023-05-02 DIAGNOSIS — M25552 Pain in left hip: Secondary | ICD-10-CM

## 2023-05-02 DIAGNOSIS — E1165 Type 2 diabetes mellitus with hyperglycemia: Secondary | ICD-10-CM

## 2023-05-02 DIAGNOSIS — M1712 Unilateral primary osteoarthritis, left knee: Secondary | ICD-10-CM

## 2023-05-02 MED ORDER — TRAMADOL HCL 50 MG PO TABS: 50.0000 mg | ORAL_TABLET | Freq: Two times a day (BID) | ORAL | 2 refills | Status: DC | PRN

## 2023-05-02 NOTE — Progress Notes (Signed)
Office Visit Note   Patient: Judy Martinez           Date of Birth: 1950-11-23           MRN: [id number] Visit Date: 05/02/2023              Requested by: Claiborne Rigg, NP [address] [place],  [place] [address] PCP: Claiborne Rigg, NP   Assessment & Plan: Visit Diagnoses:  1. Chronic pain of left knee   2. Pain in left hip     Plan: Judy Martinez is a 72 year old female with left hip and knee pain.  Impression is symptomatic left hip osteoarthritis.  The knee seems to be not the source of the pain.  Treatment options were explained and she would like to try an intra-articular steroid injection with Dr. Shon Baton first.  She will follow-up with me if the shot is not effective or if the relief is short-lived.  Follow-Up Instructions: No follow-ups on file.   Orders:  Orders Placed This Encounter  Procedures   XR Knee 1-2 Views Left   XR Pelvis 1-2 Views   XR Lumbar Spine 2-3 Views   Meds ordered this encounter  Medications   traMADol (ULTRAM) 50 MG tablet    Sig: Take 1 tablet (50 mg total) by mouth every 12 (twelve) hours as needed.    Dispense:  30 tablet    Refill:  2      Procedures: No procedures performed   Clinical Data: No additional findings.   Subjective: Chief Complaint  Patient presents with   Left Hip - Pain   Left Knee - Pain    HPI [name] is a 72 year old female here for evaluation of left hip and knee and back pain for 2 months.  Denies any groin pain.  Pain is worse when getting up from a seated position.  Underwent left total knee replacement about 4 years ago.  She feels pain that radiates to the hip from the knee. Review of Systems  Constitutional: Negative.   HENT: Negative.    Eyes: Negative.   Respiratory: Negative.    Cardiovascular: Negative.   Endocrine: Negative.   Musculoskeletal: Negative.   Neurological: Negative.   Hematological: Negative.   Psychiatric/Behavioral: Negative.    All other systems reviewed  and are negative.    Objective: Vital Signs: LMP  (LMP Unknown)   Physical Exam Vitals and nursing note reviewed.  Constitutional:      Appearance: She is well-developed.  HENT:     Head: Atraumatic.     Nose: Nose normal.  Eyes:     Extraocular Movements: Extraocular movements intact.  Cardiovascular:     Pulses: Normal pulses.  Pulmonary:     Effort: Pulmonary effort is normal.  Abdominal:     Palpations: Abdomen is soft.  Musculoskeletal:     Cervical back: Neck supple.  Skin:    General: Skin is warm.     Capillary Refill: Capillary refill takes less than 2 seconds.  Neurological:     Mental Status: She is alert. Mental status is at baseline.  Psychiatric:        Behavior: Behavior normal.        Thought Content: Thought content normal.        Judgment: Judgment normal.     Ortho Exam Examination of the left lower extremity shows unremarkable knee and lumbar spine exam.  She has reproducible severe pain with movement of the hip  joint.  Slight trochanteric tenderness. Specialty Comments:  No specialty comments available.  Imaging: No results found.   PMFS History: Patient Active Problem List   Diagnosis Date Noted   Status post total knee replacement, left 06/17/2019   Status post total left knee replacement 06/02/2019   Primary osteoarthritis of left knee    Elevated liver enzymes 09/25/2018   Cocaine use 09/25/2018   Smoker unmotivated to quit 09/25/2018   Chronic bilateral low back pain with sciatica 08/28/2018   Chronic pain of left knee 08/28/2018   Alcohol abuse    Chronic insomnia    Hyperlipidemia    Essential hypertension    History of CVA (cerebrovascular accident)    Past Medical History:  Diagnosis Date   Arthritis    Heart attack (HCC) 2004   Hypertension    Stroke (HCC) 2015    Family History  Problem Relation Age of Onset   Alcohol abuse Father    Esophageal cancer Neg Hx    Colon cancer Neg Hx    Colon polyps Neg Hx     Rectal cancer Neg Hx    Stomach cancer Neg Hx     Past Surgical History:  Procedure Laterality Date   ABDOMINAL HYSTERECTOMY     COLONOSCOPY  10/08/2019   previous in [place]   TOTAL KNEE ARTHROPLASTY Left 06/02/2019   Procedure: LEFT TOTAL KNEE ARTHROPLASTY;  Surgeon: Tarry Kos, MD;  Location: [facility] OR;  Service: Orthopedics;  Laterality: Left;   TUBAL LIGATION     Social History   Occupational History   Not on file  Tobacco Use   Smoking status: Every Day    Types: Cigarettes    Start date: 1969   Smokeless tobacco: Never  Vaping Use   Vaping status: Never Used  Substance and Sexual Activity   Alcohol use: Yes    Comment: occ beer   Drug use: Not Currently    Frequency: 2.0 times per week    Types: Cocaine, Marijuana    Comment: last used cocaine few days ago, marijuana last used 2-3 weeks ago   Sexual activity: Yes    Birth control/protection: Surgical

## 2023-05-04 ENCOUNTER — Ambulatory Visit: Payer: Self-pay | Admitting: *Deleted

## 2023-05-04 ENCOUNTER — Other Ambulatory Visit: Payer: Self-pay | Admitting: Nurse Practitioner

## 2023-05-04 DIAGNOSIS — E7800 Pure hypercholesterolemia, unspecified: Secondary | ICD-10-CM

## 2023-05-04 NOTE — Telephone Encounter (Signed)
Medication Refill - Medication: Atorvastatin 40 mg  Has the patient contacted their pharmacy? yes (Agent: If yes, when and what did the pharmacy advise?)pharmacy called directly in   Preferred Pharmacy (with phone number or street name):  [facility] [place] - [address] Phone: [phone]  Fax: [phone]     Has the patient been seen for an appointment in the last year OR does the patient have an upcoming appointment? yes  Agent: Please be advised that RX refills may take up to 3 business days. We ask that you follow-up with your pharmacy.

## 2023-05-04 NOTE — Telephone Encounter (Signed)
  Chief Complaint: Judy Martinez at [facility] needs a new rx for the atorvastatin.  This is the only medication missing. Pt. Told pharmacy she takes the atorvastatin twice a day but it's ordered once a day. Symptoms: N/A Frequency: N/A Pertinent Negatives: Patient denies N/A Disposition: [] ED /[] Urgent Care (no appt availability in office) / [] Appointment(In office/virtual)/ []  [facility]/ [] Home Care/ [] Refused Recommended Disposition /[] [facility]/ [x]  Follow-up with PCP Additional Notes: Message sent to Bertram Denver, NP that pt is taking the atorvastatin twice a day instead of once a day.     Also that the pharmacy needs a new rx for the atorvastatin.

## 2023-05-04 NOTE — Telephone Encounter (Signed)
Message from [place] B sent at 05/04/2023 12:09 PM EDT  Summary: directions   Mindy from pharmacy about atorvastin, states needs clarification of directions, she says pt states been taking twice a day, but pharmacy states its usually once a day.          Call History  Contact Date/Time Type Contact Phone/Fax User  05/04/2023 12:07 PM EDT Phone (Incoming) [facility] [place], [place] - [address] [place] (Pharmacy) [phone] Glean Salen   Reason for Disposition  [1] Pharmacy calling with prescription question AND [2] triager unable to answer question  Answer Assessment - Initial Assessment Questions 1. NAME of MEDICINE: "What medicine(s) are you calling about?"     I returned the call to Mindy at [facility]. Atorvastatin  2. QUESTION: "What is your question?" (e.g., double dose of medicine, side effect)     Pt says she takes it twice a day but it's usually ordered once a day.    Pharmacy needing to clarify this order.   Looking in her chart it's ordered once a day.    Pharmacy  needs a new order for the atorvastatin.   They got orders for her other medications. I let Minday know I would send this to Bertram Denver, NP at [facility].   Mindy is going to send a copy to Zelda too for her records.  3. PRESCRIBER: "Who prescribed the medicine?" Reason: if prescribed by specialist, call should be referred to that group.     Bertram Denver, NP 4. SYMPTOMS: "Do you have any symptoms?" If Yes, ask: "What symptoms are you having?"  "How bad are the symptoms (e.g., mild, moderate, severe)     N/A 5. PREGNANCY:  "Is there any chance that you are pregnant?" "When was your last menstrual period?"     N/A  Protocols used: Medication Question Call-A-AH

## 2023-05-04 NOTE — Telephone Encounter (Signed)
Rx sent to pharmacy with correct sig.

## 2023-05-07 NOTE — Telephone Encounter (Signed)
Request was refilled 05/04/23 for 90 and 1 refill, duplicate request.  Requested Prescriptions  Pending Prescriptions Disp Refills   atorvastatin (LIPITOR) 40 MG tablet 90 tablet 2    Sig: Take 1 tablet (40 mg total) by mouth daily. FOR CHOLESTEROL     Cardiovascular:  Antilipid - Statins Failed - 05/04/2023  1:59 PM      Failed - Lipid Panel in normal range within the last 12 months    Cholesterol, Total  Date Value Ref Range Status  04/23/2023 189 100 - 199 mg/dL Final   LDL Chol Calc (NIH)  Date Value Ref Range Status  04/23/2023 100 (H) 0 - 99 mg/dL Final   HDL  Date Value Ref Range Status  04/23/2023 71 >39 mg/dL Final   Triglycerides  Date Value Ref Range Status  04/23/2023 101 0 - 149 mg/dL Final         Passed - Patient is not pregnant      Passed - Valid encounter within last 12 months    Recent Outpatient Visits           2 weeks ago Type 2 diabetes mellitus with hyperglycemia, unspecified whether long term insulin use (HCC)   [facility] [place], Shea Stakes, NP   4 months ago Medication management   [facility] [place], [place] L, RPH-CPP   9 months ago Type 2 diabetes mellitus with hyperglycemia, unspecified whether long term insulin use (HCC)   [place] [facility] [place], [place], MD   1 year ago Type 2 diabetes mellitus with hyperglycemia, unspecified whether long term insulin use [facility])   [place] [facility] [place], [place] L, RPH-CPP   1 year ago Type 2 diabetes mellitus with hyperglycemia, unspecified whether long term insulin use [facility])   [facility] [place], Cornelius Moras, RPH-CPP

## 2023-05-09 ENCOUNTER — Ambulatory Visit (INDEPENDENT_AMBULATORY_CARE_PROVIDER_SITE_OTHER): Payer: 59 | Admitting: Sports Medicine

## 2023-05-09 ENCOUNTER — Encounter: Payer: Self-pay | Admitting: Sports Medicine

## 2023-05-09 ENCOUNTER — Other Ambulatory Visit: Payer: Self-pay

## 2023-05-09 VITALS — BP 106/72 | HR 82

## 2023-05-09 DIAGNOSIS — M25552 Pain in left hip: Secondary | ICD-10-CM | POA: Diagnosis not present

## 2023-05-09 DIAGNOSIS — M1612 Unilateral primary osteoarthritis, left hip: Secondary | ICD-10-CM | POA: Diagnosis not present

## 2023-05-09 MED ORDER — LIDOCAINE HCL 1 % IJ SOLN
4.0000 mL | INTRAMUSCULAR | Status: AC | PRN
Start: 2023-05-09 — End: 2023-05-09
  Administered 2023-05-09: 4 mL

## 2023-05-09 MED ORDER — METHYLPREDNISOLONE ACETATE 40 MG/ML IJ SUSP
40.0000 mg | INTRAMUSCULAR | Status: AC | PRN
Start: 2023-05-09 — End: 2023-05-09
  Administered 2023-05-09: 40 mg via INTRA_ARTICULAR

## 2023-05-09 NOTE — Progress Notes (Signed)
   Procedure Note  Patient: Judy Martinez             Date of Birth: 1950-10-16           MRN: [id number]             Visit Date: 05/09/2023  Procedures: Visit Diagnoses:  1. Unilateral primary osteoarthritis, left hip   2. Pain in left hip    Large Joint Inj: L hip joint on 05/09/2023 10:11 AM Indications: pain Details: 22 G 3.5 in needle, ultrasound-guided anterior approach Medications: 4 mL lidocaine 1 %; 40 mg methylPREDNISolone acetate 40 MG/ML Outcome: tolerated well, no immediate complications  Procedure: US-guided intra-articular hip injection, left  After discussion on risks/benefits/indications and informed verbal consent was obtained, a timeout was performed. Patient was lying supine on exam table. The hip was cleaned with betadine and alcohol swabs. Then utilizing ultrasound guidance, the patient's femoral head and neck junction was identified and subsequently injected with 4:1 lidocaine:depomedrol via an in-plane approach with ultrasound visualization of the injectate administered into the hip joint. Patient tolerated procedure well without immediate complications.  Procedure, treatment alternatives, risks and benefits explained, specific risks discussed. Consent was given by the patient. Immediately prior to procedure a time out was called to verify the correct patient, procedure, equipment, support staff and site/side marked as required. Patient was prepped and draped in the usual sterile fashion.     - I evaluated the patient about 10 minutes post-injection and she had good improvement in pain with anesthetic portion - follow-up with Dr. Roda Shutters as indicated; I am happy to see them as needed  Madelyn Brunner, DO Primary Care Sports Medicine Physician  [facility]  This note was dictated using Dragon naturally speaking software and may contain errors in syntax, spelling, or content which have not been identified prior to signing this note.

## 2023-05-11 ENCOUNTER — Ambulatory Visit
Admission: RE | Admit: 2023-05-11 | Discharge: 2023-05-11 | Disposition: A | Discharge status: Home / Self Care | Payer: 59 | Source: Ambulatory Visit | Attending: Nurse Practitioner | Admitting: Nurse Practitioner

## 2023-05-11 DIAGNOSIS — F17200 Nicotine dependence, unspecified, uncomplicated: Secondary | ICD-10-CM

## 2023-05-11 DIAGNOSIS — F17210 Nicotine dependence, cigarettes, uncomplicated: Secondary | ICD-10-CM | POA: Diagnosis not present

## 2023-09-05 ENCOUNTER — Other Ambulatory Visit: Payer: Self-pay | Admitting: Family Medicine

## 2023-09-05 DIAGNOSIS — E7800 Pure hypercholesterolemia, unspecified: Secondary | ICD-10-CM

## 2023-12-04 ENCOUNTER — Other Ambulatory Visit: Payer: Self-pay | Admitting: Family Medicine

## 2023-12-04 DIAGNOSIS — E7800 Pure hypercholesterolemia, unspecified: Secondary | ICD-10-CM

## 2024-02-26 IMAGING — MG MM DIGITAL SCREENING BILAT W/ TOMO AND CAD
6 of 12 series · 6 of 36 positions shown · non-contrast
Comparison: Previous exam(s).

CLINICAL DATA: Screening.

EXAM:
DIGITAL SCREENING BILATERAL MAMMOGRAM WITH TOMOSYNTHESIS AND CAD
TECHNIQUE: Bilateral screening digital craniocaudal and mediolateral oblique
mammograms were obtained. Bilateral screening digital breast
tomosynthesis was performed. The images were evaluated with
computer-aided detection.

[R MLO synth-2D (1 of 2)]
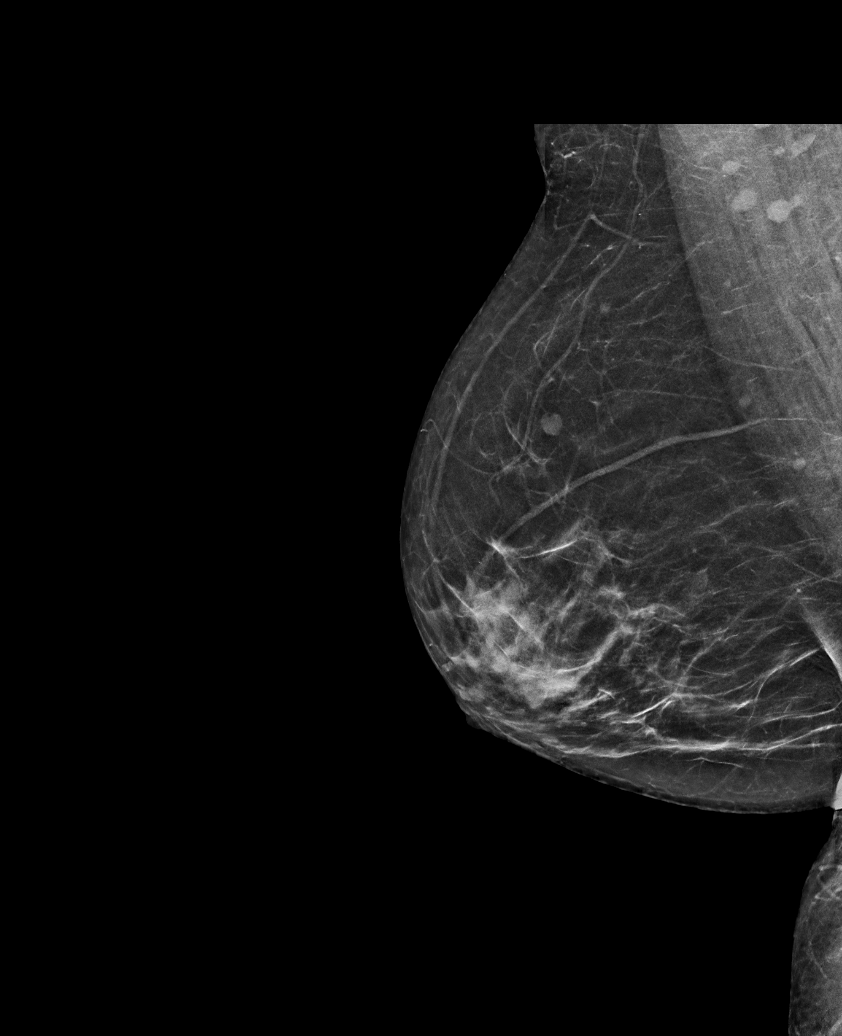

[R MLO synth-2D (2 of 2)]
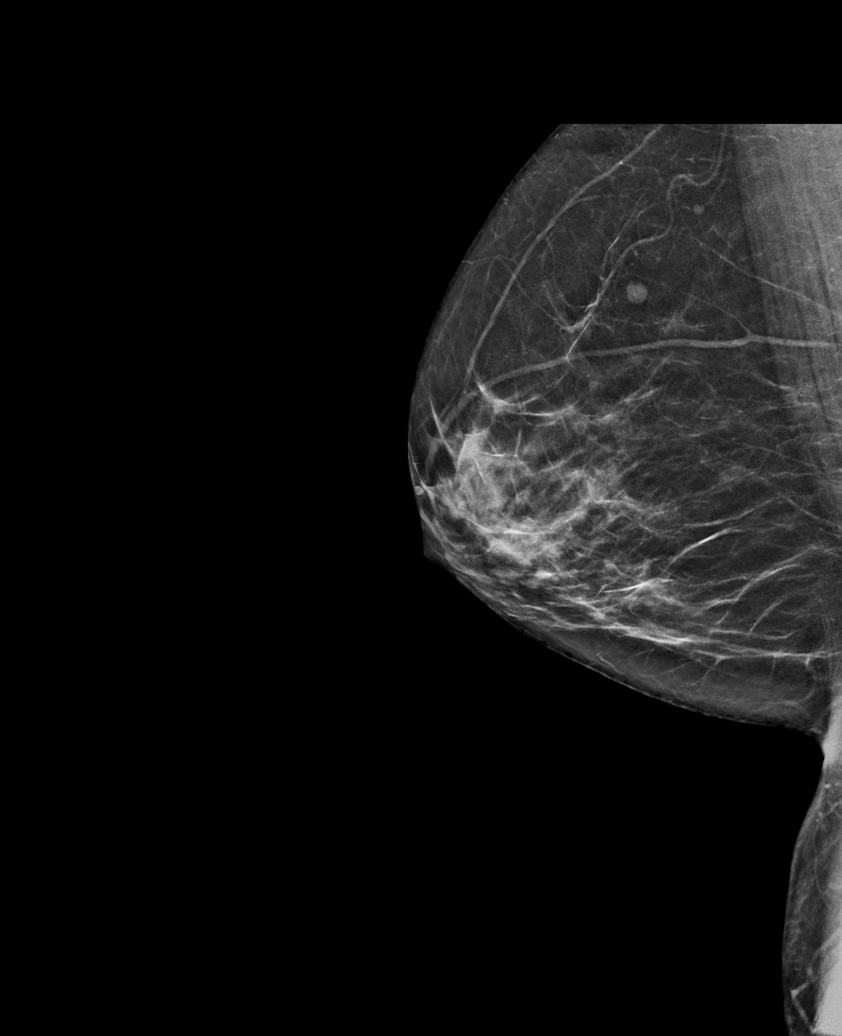

[L MLO synth-2D (1 of 2)]
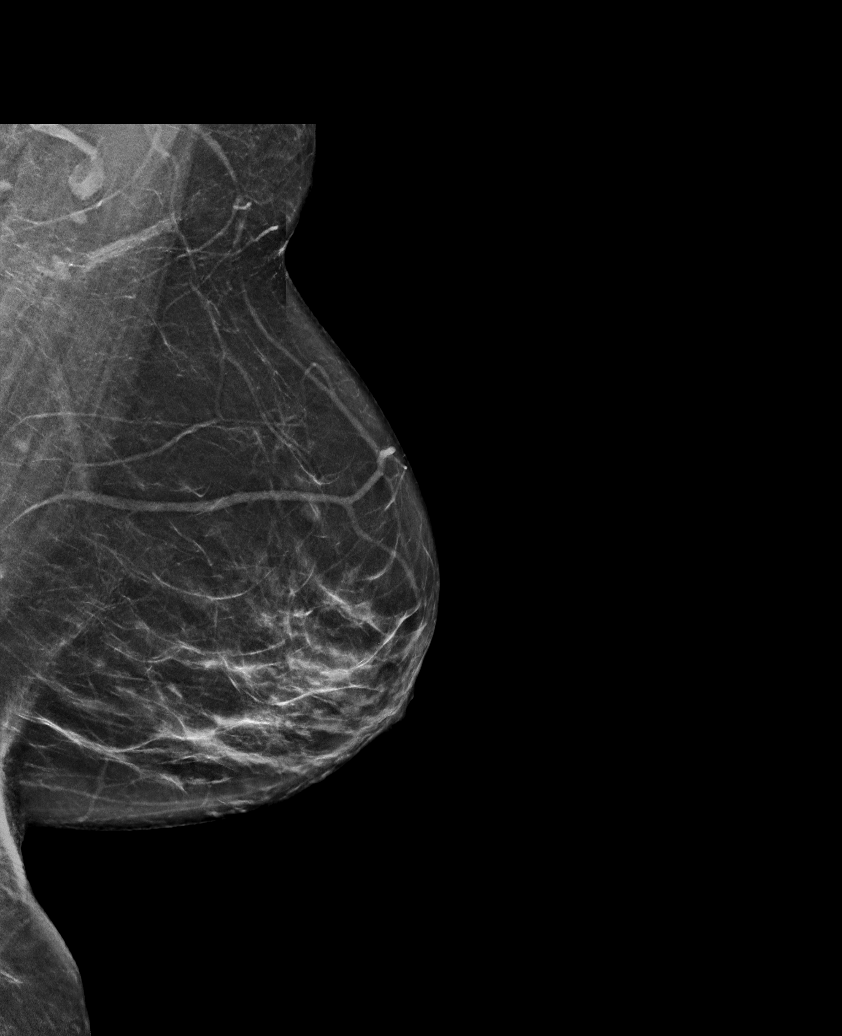

[R CC synth-2D]
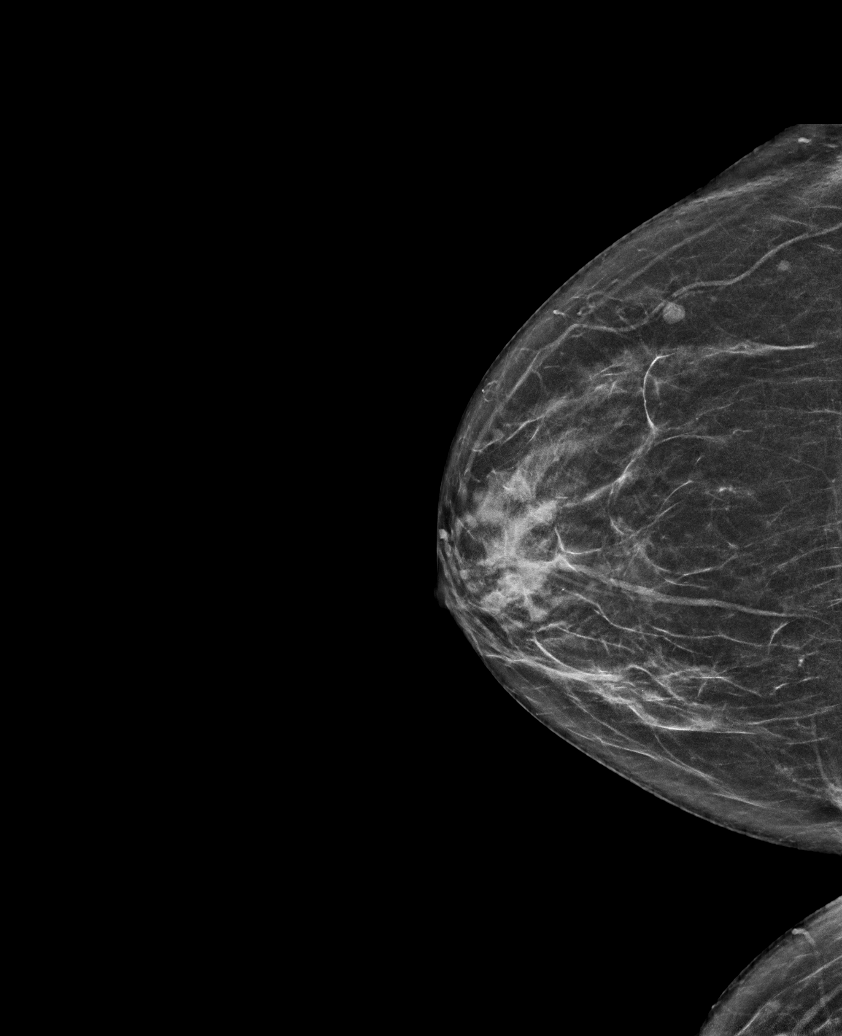

[L MLO synth-2D (2 of 2)]
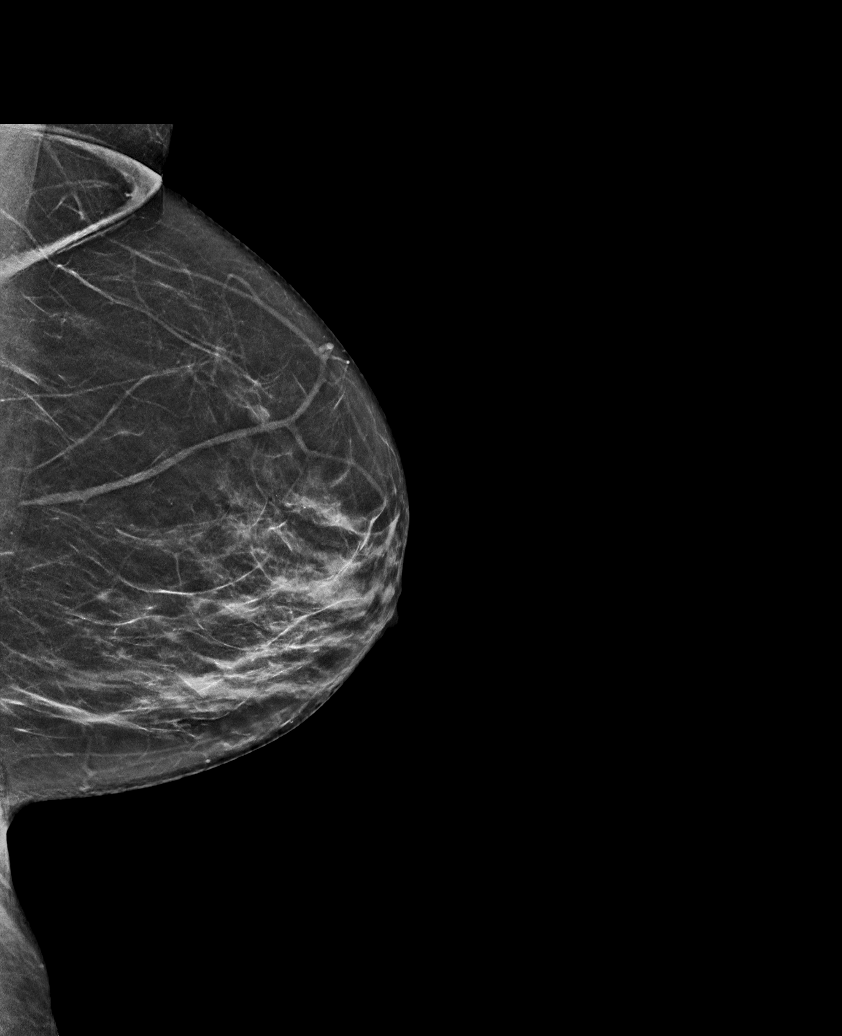

[L CC synth-2D]
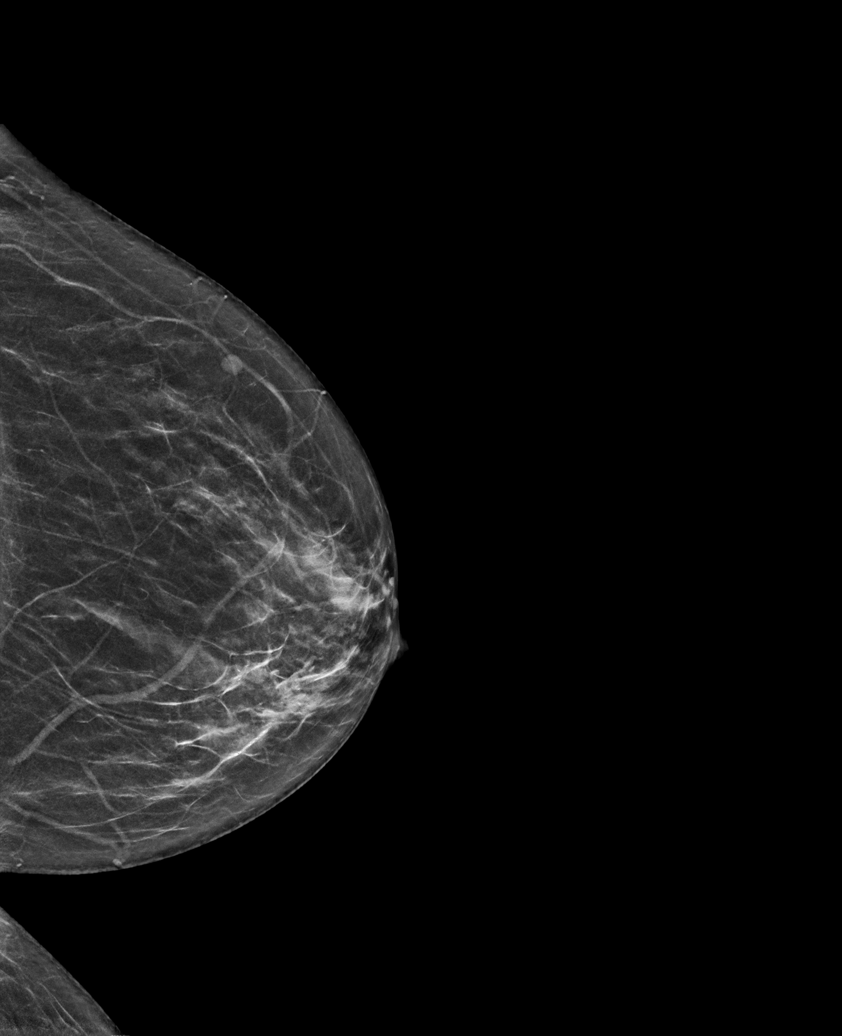

[6 of 36 positions shown; findings below may reference images not displayed]

ACR Breast Density Category b: There are scattered areas of
fibroglandular density.
FINDINGS: There are no findings suspicious for malignancy.
IMPRESSION: No mammographic evidence of malignancy. A result letter of this
screening mammogram will be mailed directly to the patient.

RECOMMENDATION:
Screening mammogram in one year. (Code:51-O-LD2)

BI-RADS CATEGORY  1: Negative.

## 2024-02-29 ENCOUNTER — Other Ambulatory Visit: Payer: Self-pay | Admitting: Family Medicine

## 2024-02-29 DIAGNOSIS — I10 Essential (primary) hypertension: Secondary | ICD-10-CM

## 2024-02-29 DIAGNOSIS — F5104 Psychophysiologic insomnia: Secondary | ICD-10-CM

## 2024-02-29 DIAGNOSIS — E1165 Type 2 diabetes mellitus with hyperglycemia: Secondary | ICD-10-CM

## 2024-03-04 ENCOUNTER — Other Ambulatory Visit: Payer: Self-pay | Admitting: Nurse Practitioner

## 2024-03-04 DIAGNOSIS — M1712 Unilateral primary osteoarthritis, left knee: Secondary | ICD-10-CM

## 2024-04-02 ENCOUNTER — Other Ambulatory Visit: Payer: Self-pay | Admitting: Pharmacist

## 2024-04-02 ENCOUNTER — Telehealth: Payer: Self-pay | Admitting: Pharmacist

## 2024-04-02 DIAGNOSIS — E7800 Pure hypercholesterolemia, unspecified: Secondary | ICD-10-CM

## 2024-04-02 MED ORDER — ATORVASTATIN CALCIUM 40 MG PO TABS
40.0000 mg | ORAL_TABLET | Freq: Every day | ORAL | 0 refills | Status: DC
Start: 2024-04-02 — End: 2024-05-14

## 2024-04-02 NOTE — Telephone Encounter (Signed)
 Can we schedule this patient for a PCP visit? She is due some refills but I am unable to give more than a 30-day supply. She has not been seen since 04/2023.

## 2024-04-02 NOTE — Progress Notes (Signed)
 Pharmacy Quality Measure Review  This patient is appearing on a report for being at risk of failing the adherence measure for cholesterol (statin) medications this calendar year.   Medication: atorvastatin   Last fill date: 12/31/2023 for 30 day supply  Contacted pharmacy to facilitate refills.   Herlene Fleeta Morris, PharmD, JAQUELINE, CPP Clinical Pharmacist [facility] & [facility] [phone]

## 2024-04-04 ENCOUNTER — Telehealth: Payer: Self-pay | Admitting: Pharmacist

## 2024-04-04 NOTE — Telephone Encounter (Signed)
 Pharmacy Quality Measure Review  This patient is appearing on a report for being at risk of failing the adherence measure for cholesterol (statin) medications this calendar year.   Medication: atorvastatin   Last fill date: 04/04/2024 for 30 day supply  Reminder set to check for refills next month.  Herlene Fleeta Morris, PharmD, JAQUELINE, CPP Clinical Pharmacist [facility] & [facility] [phone]

## 2024-04-08 ENCOUNTER — Other Ambulatory Visit: Payer: Self-pay | Admitting: Family Medicine

## 2024-04-08 ENCOUNTER — Telehealth: Payer: Self-pay | Admitting: Nurse Practitioner

## 2024-04-08 DIAGNOSIS — E7800 Pure hypercholesterolemia, unspecified: Secondary | ICD-10-CM

## 2024-04-08 NOTE — Telephone Encounter (Signed)
 Called patient, no answer. Left voicemail confirming upcoming appointment on 04/09/2024 at 3:30. Provided callback number for any questions or changes.

## 2024-04-09 ENCOUNTER — Ambulatory Visit: Admitting: Nurse Practitioner

## 2024-04-09 NOTE — Telephone Encounter (Signed)
 Requested medication (s) are due for refill today:   Yes  Requested medication (s) are on the active medication list:   Yes  Future visit scheduled:   Yes Today 7/30 at 3:30 with Zelda.    Last ordered: 04/02/2024 #30, 0 refills until OV.  Unable to refill because lipid panel is due; has appt today 7/30.   Requested Prescriptions  Pending Prescriptions Disp Refills   atorvastatin  (LIPITOR) 40 MG tablet [Pharmacy Med Name: Atorvastatin  Calcium  40mg  Tablet] 30 tablet 11    Sig: Take 1 tablet by mouth daily. Need appt for refill     Cardiovascular:  Antilipid - Statins Failed - 04/09/2024  9:51 AM      Failed - Lipid Panel in normal range within the last 12 months    Cholesterol, Total  Date Value Ref Range Status  04/23/2023 189 100 - 199 mg/dL Final   LDL Chol Calc (NIH)  Date Value Ref Range Status  04/23/2023 100 (H) 0 - 99 mg/dL Final   HDL  Date Value Ref Range Status  04/23/2023 71 >39 mg/dL Final   Triglycerides  Date Value Ref Range Status  04/23/2023 101 0 - 149 mg/dL Final         Passed - Patient is not pregnant      Passed - Valid encounter within last 12 months    Recent Outpatient Visits           11 months ago Type 2 diabetes mellitus with hyperglycemia, unspecified whether long term insulin use (HCC)   [place] [facility] [place] Theotis Haze ORN, NP   1 year ago Medication management   [place] [facility] [place] [facility] [place]. [facility] Fleeta Morris, [place] L, RPH-CPP   1 year ago Type 2 diabetes mellitus with hyperglycemia, unspecified whether long term insulin use (HCC)   [place] [facility] [place]. [facility] Delbert Clam, MD   2 years ago Type 2 diabetes mellitus with hyperglycemia, unspecified whether long term insulin use (HCC)   [place] [facility] [place] [facility] [place]. [facility] Fleeta Morris, Faith L, RPH-CPP    2 years ago Type 2 diabetes mellitus with hyperglycemia, unspecified whether long term insulin use (HCC)    [facility] [place]. [facility] Fleeta Morris Garnette LITTIE, RPH-CPP

## 2024-04-15 ENCOUNTER — Encounter

## 2024-05-06 ENCOUNTER — Other Ambulatory Visit: Payer: Self-pay | Admitting: Family Medicine

## 2024-05-06 DIAGNOSIS — F5104 Psychophysiologic insomnia: Secondary | ICD-10-CM

## 2024-05-06 DIAGNOSIS — I10 Essential (primary) hypertension: Secondary | ICD-10-CM

## 2024-05-06 DIAGNOSIS — E1165 Type 2 diabetes mellitus with hyperglycemia: Secondary | ICD-10-CM

## 2024-05-07 ENCOUNTER — Telehealth: Payer: Self-pay | Admitting: Pharmacist

## 2024-05-07 ENCOUNTER — Other Ambulatory Visit: Payer: Self-pay | Admitting: Pharmacist

## 2024-05-07 NOTE — Progress Notes (Signed)
 Pharmacy Quality Measure Review  This patient is appearing on a report for being at risk of failing the adherence measure for cholesterol (statin) medications this calendar year.   Medication: atorvastatin   Last fill date: 04/04/24 for 30 day supply  Cannot refill at this time as patient has not been seen by her PCP recently. Message sent to the front to see if we can schedule her for an OV.   Herlene Fleeta Morris, PharmD, JAQUELINE, CPP Clinical Pharmacist [facility] & [facility] [phone]

## 2024-05-07 NOTE — Telephone Encounter (Signed)
 Can we schedule this patient for a PCP visit?

## 2024-05-13 ENCOUNTER — Telehealth: Payer: Self-pay | Admitting: Nurse Practitioner

## 2024-05-13 NOTE — Telephone Encounter (Signed)
Pt confirmed appt 9/2

## 2024-05-14 ENCOUNTER — Encounter: Payer: Self-pay | Admitting: Nurse Practitioner

## 2024-05-14 ENCOUNTER — Ambulatory Visit: Attending: Nurse Practitioner | Admitting: Nurse Practitioner

## 2024-05-14 VITALS — BP 129/77 | HR 67 | Resp 19 | Ht 63.5 in | Wt 146.8 lb

## 2024-05-14 DIAGNOSIS — E1165 Type 2 diabetes mellitus with hyperglycemia: Secondary | ICD-10-CM

## 2024-05-14 DIAGNOSIS — M1712 Unilateral primary osteoarthritis, left knee: Secondary | ICD-10-CM

## 2024-05-14 DIAGNOSIS — Z23 Encounter for immunization: Secondary | ICD-10-CM

## 2024-05-14 DIAGNOSIS — J302 Other seasonal allergic rhinitis: Secondary | ICD-10-CM

## 2024-05-14 DIAGNOSIS — E7800 Pure hypercholesterolemia, unspecified: Secondary | ICD-10-CM | POA: Diagnosis not present

## 2024-05-14 DIAGNOSIS — F5104 Psychophysiologic insomnia: Secondary | ICD-10-CM | POA: Diagnosis not present

## 2024-05-14 DIAGNOSIS — Z1231 Encounter for screening mammogram for malignant neoplasm of breast: Secondary | ICD-10-CM

## 2024-05-14 DIAGNOSIS — Z7984 Long term (current) use of oral hypoglycemic drugs: Secondary | ICD-10-CM

## 2024-05-14 DIAGNOSIS — I10 Essential (primary) hypertension: Secondary | ICD-10-CM | POA: Diagnosis not present

## 2024-05-14 DIAGNOSIS — R7303 Prediabetes: Secondary | ICD-10-CM

## 2024-05-14 DIAGNOSIS — E559 Vitamin D deficiency, unspecified: Secondary | ICD-10-CM

## 2024-05-14 DIAGNOSIS — Z79899 Other long term (current) drug therapy: Secondary | ICD-10-CM

## 2024-05-14 MED ORDER — GABAPENTIN 300 MG PO CAPS
300.0000 mg | ORAL_CAPSULE | Freq: Three times a day (TID) | ORAL | 2 refills | Status: DC
Start: 2024-05-14 — End: 2024-07-01

## 2024-05-14 MED ORDER — ATORVASTATIN CALCIUM 40 MG PO TABS: 40.0000 mg | ORAL_TABLET | Freq: Every day | ORAL | 1 refills | Status: DC

## 2024-05-14 MED ORDER — METFORMIN HCL ER 500 MG PO TB24: 500.0000 mg | ORAL_TABLET | Freq: Every day | ORAL | 1 refills | Status: DC

## 2024-05-14 MED ORDER — LISINOPRIL 10 MG PO TABS: 10.0000 mg | ORAL_TABLET | Freq: Every day | ORAL | 1 refills | Status: AC

## 2024-05-14 MED ORDER — LEVOCETIRIZINE DIHYDROCHLORIDE 5 MG PO TABS: 5.0000 mg | ORAL_TABLET | Freq: Every evening | ORAL | 1 refills | Status: DC

## 2024-05-14 MED ORDER — QUETIAPINE FUMARATE 50 MG PO TABS: 50.0000 mg | ORAL_TABLET | Freq: Every day | ORAL | 1 refills | Status: DC

## 2024-05-14 MED ORDER — VITAMIN D (ERGOCALCIFEROL) 1.25 MG (50000 UNIT) PO CAPS
50000.0000 [IU] | ORAL_CAPSULE | ORAL | 0 refills | Status: AC
Start: 2024-05-14 — End: ?

## 2024-05-14 MED ORDER — LISINOPRIL 10 MG PO TABS: 10.0000 mg | ORAL_TABLET | Freq: Every day | ORAL | 1 refills | Status: DC

## 2024-05-14 NOTE — Progress Notes (Signed)
 Congestion  Needs pain medicine

## 2024-05-14 NOTE — Progress Notes (Signed)
 Assessment & Plan:  Judy Martinez was seen today for hypertension.  Diagnoses and all orders for this visit:  Primary hypertension -     lisinopril  (ZESTRIL ) 10 MG tablet; Take 1 tablet (10 mg total) by mouth daily. FOR BLOOD PRESSURE Continue all antihypertensives as prescribed.  Reminded to bring in blood pressure log for follow  up appointment.  RECOMMENDATIONS: DASH/Mediterranean Diets are healthier choices for HTN.   Follow-up  Prediabetes Prediabetes managed with metformin . A1c needs to be checked to assess current glycemic control. - Order A1c test. -     Urine Albumin/Creatinine with ratio (send out) [LAB689] -     CMP14+EGFR -     metFORMIN  (GLUCOPHAGE -XR) 500 MG 24 hr tablet; Take 1 tablet (500 mg total) by mouth daily. FOR DIABETES -     Hemoglobin A1c  Need for influenza vaccination -     Flu vaccine trivalent PF, 6mos and older(Flulaval,Afluria,Fluarix,Fluzone)  Encounter for screening mammogram for malignant neoplasm of breast -     Cancel: MS 3D SCR MAMMO BILAT BR (aka MM); Future -     MM 3D SCREENING MAMMOGRAM BILATERAL BREAST; Future  Chronic insomnia -     QUEtiapine  (SEROQUEL ) 50 MG tablet; Take 1 tablet (50 mg total) by mouth daily. FOR DEPRESSION  Hypercholesterolemia -     atorvastatin  (LIPITOR) 40 MG tablet; Take 1 tablet (40 mg total) by mouth daily. FOR CHOLESTEROL  Primary osteoarthritis of left knee -     gabapentin  (NEURONTIN ) 300 MG capsule; Take 1 capsule (300 mg total) by mouth 3 (three) times daily. Chronic pain management with gabapentin  as tramadol  is discontinued. Gabapentin  dosage is being increased. If ineffective, tramadol  may be reconsidered but will need drug screen prior - Increase gabapentin  dosage. - If increased gabapentin  is ineffective, discontinue gabapentin  and consider restarting tramadol .  Vitamin D  deficiency disease -     Vitamin D , Ergocalciferol , (DRISDOL ) 1.25 MG (50000 UNIT) CAPS capsule; Take 1 capsule (50,000 Units total)  by mouth once a week. FOR VIT D DEFICIENCY -     VITAMIN D  25 Hydroxy (Vit-D Deficiency, Fractures)  Seasonal allergies -     levocetirizine (XYZAL ) 5 MG tablet; Take 1 tablet (5 mg total) by mouth every evening. FOR ALLERGIES Experiencing sneezing due to allergic rhinitis. Insurance covers levocetirizine (Xyzal ). - Prescribe levocetirizine (Xyzal ) for allergies.     General Health Maintenance Mammogram is due. Vitamin D  levels previously low. - Order mammogram. - Check vitamin D  levels. - Prescribe weekly vitamin D  supplementation.  Patient has been counseled on age-appropriate routine health concerns for screening and prevention. These are reviewed and up-to-date. Referrals have been placed accordingly. Immunizations are up-to-date or declined.    Subjective:   Chief Complaint  Patient presents with   Hypertension   History of Present Illness Judy Martinez is a 73 year old female who presents for HTN, prediabetes and medication management.  I have not seen her personally in this office since 04-2023  PMH significant for  Arthritis, MI (2004), Hypertension, prediabetes, HPL, L knee OA with TKR 05-2020 and Stroke (2015).     She has chronic knee pain and was previously under the care of an orthopedist. She was prescribed tramadol  and received injections, but despite treatments her knee pain persisted, leading her to discontinue the visits. She currently uses a cane for ambulation, although she forgot it today. She is interested in resuming tramadol  but is currently taking gabapentin  for pain management.  She experiences allergy symptoms, primarily  sneezing, and has not been taking any over-the-counter medications for relief. She is seeking a prescription for allergy medication.  Her current medications include quetiapine  for sleep, lisinopril  for blood pressure, atorvastatin  for cholesterol, and metformin  for diabetes management. Her medication regimen is delivered through  [facility] mail order every few weeks.  HTN Blood pressure is well controlled.  She is currently prescribed lisinopril  10 mg daily BP Readings from Last 3 Encounters:  05/14/24 129/77  05/09/23 106/72  04/23/23 136/78       Review of Systems  Constitutional:  Negative for fever, malaise/fatigue and weight loss.  HENT: Negative.  Negative for nosebleeds.   Eyes: Negative.  Negative for blurred vision, double vision and photophobia.  Respiratory: Negative.  Negative for cough and shortness of breath.   Cardiovascular: Negative.  Negative for chest pain, palpitations and leg swelling.  Gastrointestinal: Negative.  Negative for heartburn, nausea and vomiting.  Musculoskeletal:  Positive for joint pain. Negative for myalgias.  Neurological: Negative.  Negative for dizziness, focal weakness, seizures and headaches.  Endo/Heme/Allergies:  Positive for environmental allergies.  Psychiatric/Behavioral:  Negative for suicidal ideas. The patient has insomnia.     Past Medical History:  Diagnosis Date   Arthritis    Heart attack (HCC) 2004   Hypertension    Stroke [facility]) 2015    Past Surgical History:  Procedure Laterality Date   ABDOMINAL HYSTERECTOMY     COLONOSCOPY  10/08/2019   previous in [place]   TOTAL KNEE ARTHROPLASTY Left 06/02/2019   Procedure: LEFT TOTAL KNEE ARTHROPLASTY;  Surgeon: Jerri Kay HERO, MD;  Location: [facility] OR;  Service: Orthopedics;  Laterality: Left;   TUBAL LIGATION      Family History  Problem Relation Age of Onset   Alcohol abuse Father    Esophageal cancer Neg Hx    Colon cancer Neg Hx    Colon polyps Neg Hx    Rectal cancer Neg Hx    Stomach cancer Neg Hx     Social History Reviewed with no changes to be made today.   Outpatient Medications Prior to Visit  Medication Sig Dispense Refill   atorvastatin  (LIPITOR) 40 MG tablet Take 1 tablet (40 mg total) by mouth daily. Please schedule PCP visit with Devony Mcgrady for more refills. 30 tablet 0   gabapentin   (NEURONTIN ) 100 MG capsule take 1 capsule by mouth 3 times daily as needed 90 capsule 11   lisinopril  (ZESTRIL ) 10 MG tablet Take 1 tablet (10 mg total) by mouth daily. Please schedule PCP appointment for more refills. 30 tablet 0   QUEtiapine  (SEROQUEL ) 50 MG tablet Take 1 tablet (50 mg total) by mouth daily. Please schedule PCP appointment for more refills. 30 tablet 0   Accu-Chek Softclix Lancets lancets Use as instructed. Check blood glucose level by fingerstick once per day. E11.65 (Patient not taking: Reported on 05/14/2024) 100 each 12   albuterol  (VENTOLIN  HFA) 108 (90 Base) MCG/ACT inhaler Inhale 2 puffs into the lungs every 6 (six) hours as needed for wheezing or shortness of breath. (Patient not taking: Reported on 05/14/2024) 54 g 0   Blood Glucose Monitoring Suppl (ACCU-CHEK GUIDE ME) w/Device KIT 1 each by Does not apply route 2 (two) times daily. (Patient not taking: Reported on 05/14/2024) 1 kit 0   glucose blood (ACCU-CHEK GUIDE) test strip Use as instructed. Check blood glucose level by fingerstick once per day. E11.65 (Patient not taking: Reported on 05/14/2024) 100 each 12   vitamin B-12 (CYANOCOBALAMIN) 1000 MCG  tablet Take 1,000 mcg by mouth every 7 (seven) days. (Patient not taking: Reported on 05/14/2024)     metFORMIN  (GLUCOPHAGE -XR) 500 MG 24 hr tablet Take 1 tablet (500 mg total) by mouth daily. Please schedule PCP appointment for more refills. (Patient not taking: Reported on 05/14/2024) 30 tablet 0   traMADol  (ULTRAM ) 50 MG tablet Take 1 tablet (50 mg total) by mouth every 12 (twelve) hours as needed. (Patient not taking: Reported on 05/14/2024) 30 tablet 2   Vitamin D , Ergocalciferol , (DRISDOL ) 1.25 MG (50000 UNIT) CAPS capsule TAKE 1 CAPSULE BY MOUTH 1 TIME A WEEK (Patient not taking: Reported on 05/14/2024) 12 capsule 0   No facility-administered medications prior to visit.    Allergies  Allergen Reactions   Penicillins Anaphylaxis and Hives    Did it involve swelling of the  face/tongue/throat, SOB, or low BP? Yes Did it involve sudden or severe rash/hives, skin peeling, or any reaction on the inside of your mouth or nose? No Did you need to seek medical attention at a hospital or doctor's office? Yes When did it last happen?    Over 10 years ago   If all above answers are NO, may proceed with cephalosporin use.        Objective:    BP 129/77 (BP Location: Left Arm, Patient Position: Sitting, Cuff Size: Normal)   Pulse 67   Resp 19   Ht 5' 3.5 (1.613 m)   Wt 146 lb 12.8 oz (66.6 kg)   LMP  (LMP Unknown)   SpO2 100%   BMI 25.60 kg/m  Wt Readings from Last 3 Encounters:  05/14/24 146 lb 12.8 oz (66.6 kg)  04/23/23 145 lb (65.8 kg)  11/08/22 140 lb (63.5 kg)    Physical Exam Vitals and nursing note reviewed.  Constitutional:      Appearance: She is well-developed.  HENT:     Head: Normocephalic and atraumatic.  Cardiovascular:     Rate and Rhythm: Normal rate and regular rhythm.     Heart sounds: Normal heart sounds. No murmur heard.    No friction rub. No gallop.  Pulmonary:     Effort: Pulmonary effort is normal. No tachypnea or respiratory distress.     Breath sounds: Normal breath sounds. No decreased breath sounds, wheezing, rhonchi or rales.  Chest:     Chest wall: No tenderness.  Abdominal:     General: Bowel sounds are normal.     Palpations: Abdomen is soft.  Musculoskeletal:        General: Normal range of motion.     Cervical back: Normal range of motion.  Skin:    General: Skin is warm and dry.  Neurological:     Mental Status: She is alert and oriented to person, place, and time.     Coordination: Coordination normal.  Psychiatric:        Behavior: Behavior normal. Behavior is cooperative.        Thought Content: Thought content normal.        Judgment: Judgment normal.          Patient has been counseled extensively about nutrition and exercise as well as the importance of adherence with medications and regular  follow-up. The patient was given clear instructions to go to ER or return to medical center if symptoms don't improve, worsen or new problems develop. The patient verbalized understanding.   Follow-up: No follow-ups on file.   Haze LELON Servant, FNP-BC [facility]  [place], [place] [phone]   05/19/2024, 12:48 PM

## 2024-05-16 LAB — CMP14+EGFR
ALT: 24 IU/L (ref 0–32)
AST: 23 IU/L (ref 0–40)
Albumin: 4.7 g/dL (ref 3.8–4.8)
Alkaline Phosphatase: 85 IU/L (ref 44–121)
BUN/Creatinine Ratio: 12 (ref 12–28)
BUN: 13 mg/dL (ref 8–27)
Bilirubin Total: 0.3 mg/dL (ref 0.0–1.2)
CO2: 21 mmol/L (ref 20–29)
Calcium: 10.5 mg/dL — ABNORMAL HIGH (ref 8.7–10.3)
Chloride: 105 mmol/L (ref 96–106)
Creatinine, Ser: 1.05 mg/dL — ABNORMAL HIGH (ref 0.57–1.00)
Globulin, Total: 2.7 g/dL (ref 1.5–4.5)
Glucose: 96 mg/dL (ref 70–99)
Potassium: 4.5 mmol/L (ref 3.5–5.2)
Sodium: 142 mmol/L (ref 134–144)
Total Protein: 7.4 g/dL (ref 6.0–8.5)
eGFR: 56 mL/min/1.73 — ABNORMAL LOW (ref >59)

## 2024-05-16 LAB — VITAMIN D 25 HYDROXY (VIT D DEFICIENCY, FRACTURES): Vit D, 25-Hydroxy: 15.7 ng/mL — ABNORMAL LOW (ref 30.0–100.0)

## 2024-05-16 LAB — HEMOGLOBIN A1C
Est. average glucose Bld gHb Est-mCnc: 137 mg/dL
Hgb A1c MFr Bld: 6.4 % — ABNORMAL HIGH (ref 4.8–5.6)

## 2024-05-16 LAB — MICROALBUMIN / CREATININE URINE RATIO
Creatinine, Urine: 179.6 mg/dL
Microalb/Creat Ratio: 3 mg/g{creat} (ref 0–29)
Microalbumin, Urine: 4.5 ug/mL

## 2024-05-18 ENCOUNTER — Ambulatory Visit: Payer: Self-pay | Admitting: Nurse Practitioner

## 2024-05-19 ENCOUNTER — Encounter: Payer: Self-pay | Admitting: Nurse Practitioner

## 2024-05-20 ENCOUNTER — Ambulatory Visit

## 2024-05-26 ENCOUNTER — Ambulatory Visit

## 2024-05-28 ENCOUNTER — Ambulatory Visit
Admission: RE | Admit: 2024-05-28 | Discharge: 2024-05-28 | Disposition: A | Discharge status: Home / Self Care | Source: Ambulatory Visit | Attending: Nurse Practitioner | Admitting: Nurse Practitioner

## 2024-05-28 DIAGNOSIS — Z1231 Encounter for screening mammogram for malignant neoplasm of breast: Secondary | ICD-10-CM

## 2024-06-02 ENCOUNTER — Other Ambulatory Visit: Payer: Self-pay | Admitting: Nurse Practitioner

## 2024-06-02 DIAGNOSIS — R928 Other abnormal and inconclusive findings on diagnostic imaging of breast: Secondary | ICD-10-CM

## 2024-06-07 ENCOUNTER — Ambulatory Visit
Admission: RE | Admit: 2024-06-07 | Discharge: 2024-06-07 | Disposition: A | Discharge status: Home / Self Care | Source: Ambulatory Visit | Attending: Nurse Practitioner | Admitting: Nurse Practitioner

## 2024-06-07 ENCOUNTER — Ambulatory Visit
Admission: RE | Admit: 2024-06-07 | Discharge: 2024-06-07 | Disposition: A | Source: Ambulatory Visit | Attending: Nurse Practitioner

## 2024-06-07 DIAGNOSIS — R928 Other abnormal and inconclusive findings on diagnostic imaging of breast: Secondary | ICD-10-CM

## 2024-06-10 ENCOUNTER — Other Ambulatory Visit: Payer: Self-pay | Admitting: Nurse Practitioner

## 2024-06-10 ENCOUNTER — Ambulatory Visit: Payer: Self-pay | Admitting: Nurse Practitioner

## 2024-06-10 DIAGNOSIS — N6325 Unspecified lump in the left breast, overlapping quadrants: Secondary | ICD-10-CM

## 2024-06-19 ENCOUNTER — Other Ambulatory Visit: Payer: Self-pay | Admitting: Pharmacist

## 2024-06-19 NOTE — Progress Notes (Signed)
 Pharmacy Quality Measure Review  This patient is appearing on a report for being at risk of failing the adherence measure for cholesterol (statin) medications this calendar year.   Medication: atorvastatin   Last fill date: 06/02/2024 for 30 day supply  Insurance report was not up to date. No action needed at this time.   Herlene Fleeta Morris, PharmD, JAQUELINE, CPP Clinical Pharmacist [facility] [phone]

## 2024-06-28 ENCOUNTER — Other Ambulatory Visit: Payer: Self-pay | Admitting: Nurse Practitioner

## 2024-06-28 DIAGNOSIS — M1712 Unilateral primary osteoarthritis, left knee: Secondary | ICD-10-CM

## 2024-06-28 DIAGNOSIS — E559 Vitamin D deficiency, unspecified: Secondary | ICD-10-CM

## 2024-07-01 ENCOUNTER — Other Ambulatory Visit: Payer: Self-pay | Admitting: Nurse Practitioner

## 2024-07-01 DIAGNOSIS — M1712 Unilateral primary osteoarthritis, left knee: Secondary | ICD-10-CM

## 2024-07-01 MED ORDER — GABAPENTIN 300 MG PO CAPS: 300.0000 mg | ORAL_CAPSULE | Freq: Three times a day (TID) | ORAL | 0 refills | Status: AC

## 2024-07-09 ENCOUNTER — Other Ambulatory Visit: Payer: Self-pay | Admitting: Pharmacist

## 2024-07-09 NOTE — Progress Notes (Signed)
 Pharmacy Quality Measure Review  This patient is appearing on a report for being at risk of failing the adherence measure for cholesterol (statin) medications this calendar year.   Medication: atorvastatin   Last fill date: 06/28/24 for 30 day supply  Insurance report was not up to date. No action needed at this time.   Herlene Fleeta Morris, PharmD, JAQUELINE, CPP Clinical Pharmacist [facility] & [facility] [phone]

## 2024-08-04 ENCOUNTER — Other Ambulatory Visit: Payer: Self-pay | Admitting: Pharmacist

## 2024-08-04 NOTE — Progress Notes (Signed)
 Pharmacy Quality Measure Review  Contacted this patient - she no longer sees us . Has started using [employer].  Herlene Fleeta Morris, PharmD, JAQUELINE, CPP Clinical Pharmacist [facility] [phone]

## 2024-08-22 ENCOUNTER — Encounter: Payer: Self-pay | Admitting: Gastroenterology

## 2024-08-26 ENCOUNTER — Telehealth: Payer: Self-pay

## 2024-08-26 NOTE — Telephone Encounter (Signed)
 Spoke to the patient and made her aware of the recommendations given. PV and colonoscopy have been cancelled.   I am not sure how do a recall. Can someone please replace a recall for 09/2029 for this patient requiring an OV first per provider.   Thank you

## 2024-08-26 NOTE — Telephone Encounter (Signed)
 Dr. Leigh,   This is a previous pt of Dr. Aneita. Last colonoscopy 09/2019 with 2 hyperplastic polyps. Per letter out it was noted for pt to follow up in 10 years. Pt is 73 currently. Please advise if you think we should go ahead and proceed with another screening colonoscopy or wait.   Thank you

## 2024-08-26 NOTE — Telephone Encounter (Signed)
 To clarify - patient would not be due for another exam until 09/2029, at which time she will be around 73 years old, an age at which most people stop routine screening. She should see us  at that time to determine if she wishes to have further colonoscopy - most people stop after the age of 72 if they have no history of polyps. I don't feel strongly that she needs another colonoscopy.

## 2024-08-26 NOTE — Telephone Encounter (Signed)
 Recall is placed for 09/2029 with Dr Leigh.

## 2024-08-27 ENCOUNTER — Other Ambulatory Visit: Payer: Self-pay | Admitting: Nurse Practitioner

## 2024-08-27 DIAGNOSIS — E7800 Pure hypercholesterolemia, unspecified: Secondary | ICD-10-CM

## 2024-09-15 ENCOUNTER — Encounter

## 2024-09-23 ENCOUNTER — Ambulatory Visit: Attending: Nurse Practitioner

## 2024-09-23 NOTE — Progress Notes (Signed)
 This encounter was created in error - please disregard. Patient stated that she is no longer a patient of [facility].  Judy Giuffre N. [name], LPN [facility] Annual Wellness Team Direct Dial: [phone]

## 2024-09-26 ENCOUNTER — Other Ambulatory Visit: Payer: Self-pay | Admitting: Nurse Practitioner

## 2024-09-26 DIAGNOSIS — F5104 Psychophysiologic insomnia: Secondary | ICD-10-CM

## 2024-09-26 DIAGNOSIS — E1165 Type 2 diabetes mellitus with hyperglycemia: Secondary | ICD-10-CM

## 2024-09-26 DIAGNOSIS — J302 Other seasonal allergic rhinitis: Secondary | ICD-10-CM

## 2024-09-29 ENCOUNTER — Encounter: Admitting: Gastroenterology

## 2024-12-08 ENCOUNTER — Encounter
